# Patient Record
Sex: Female | Born: 1945 | Race: White | Hispanic: No | Marital: Married | Smoking: Never smoker
Health system: Southern US, Community
[De-identification: ages and names within clinical notes are randomized; demographics above are authoritative.]

## PROBLEM LIST (undated history)

## (undated) DIAGNOSIS — K219 Gastro-esophageal reflux disease without esophagitis: Secondary | ICD-10-CM

## (undated) DIAGNOSIS — E785 Hyperlipidemia, unspecified: Secondary | ICD-10-CM

## (undated) DIAGNOSIS — F429 Obsessive-compulsive disorder, unspecified: Secondary | ICD-10-CM

## (undated) DIAGNOSIS — K589 Irritable bowel syndrome without diarrhea: Secondary | ICD-10-CM

## (undated) DIAGNOSIS — L309 Dermatitis, unspecified: Secondary | ICD-10-CM

## (undated) DIAGNOSIS — I10 Essential (primary) hypertension: Secondary | ICD-10-CM

## (undated) DIAGNOSIS — Z789 Other specified health status: Secondary | ICD-10-CM

## (undated) DIAGNOSIS — Z7409 Other reduced mobility: Secondary | ICD-10-CM

## (undated) DIAGNOSIS — F329 Major depressive disorder, single episode, unspecified: Secondary | ICD-10-CM

## (undated) DIAGNOSIS — K5790 Diverticulosis of intestine, part unspecified, without perforation or abscess without bleeding: Secondary | ICD-10-CM

## (undated) HISTORY — DX: Dermatitis, unspecified: L30.9

## (undated) HISTORY — DX: Gastro-esophageal reflux disease without esophagitis: K21.9

## (undated) HISTORY — DX: Hyperlipidemia, unspecified: E78.5

## (undated) HISTORY — PX: REFRACTIVE SURGERY: SHX103

## (undated) HISTORY — DX: Obsessive-compulsive disorder, unspecified: F42.9

## (undated) HISTORY — DX: Essential (primary) hypertension: I10

## (undated) HISTORY — DX: Other reduced mobility: Z74.09

## (undated) HISTORY — DX: Irritable bowel syndrome, unspecified: K58.9

## (undated) HISTORY — DX: Diverticulosis of intestine, part unspecified, without perforation or abscess without bleeding: K57.90

## (undated) HISTORY — PX: TUBAL LIGATION: SHX77

## (undated) HISTORY — PX: THYROIDECTOMY, PARTIAL: SHX18

## (undated) HISTORY — PX: LAMINECTOMY: SHX219

## (undated) HISTORY — DX: Other specified health status: Z78.9

## (undated) HISTORY — DX: Major depressive disorder, single episode, unspecified: F32.9

---

## 1998-04-13 ENCOUNTER — Other Ambulatory Visit: Admission: RE | Admit: 1998-04-13 | Discharge: 1998-04-13 | Payer: Self-pay | Admitting: Obstetrics & Gynecology

## 1999-04-18 ENCOUNTER — Other Ambulatory Visit: Admission: RE | Admit: 1999-04-18 | Discharge: 1999-04-18 | Payer: Self-pay | Admitting: Obstetrics & Gynecology

## 2000-06-04 ENCOUNTER — Other Ambulatory Visit: Admission: RE | Admit: 2000-06-04 | Discharge: 2000-06-04 | Payer: Self-pay | Admitting: Obstetrics & Gynecology

## 2001-07-14 ENCOUNTER — Other Ambulatory Visit: Admission: RE | Admit: 2001-07-14 | Discharge: 2001-07-14 | Payer: Self-pay | Admitting: Obstetrics & Gynecology

## 2002-07-21 ENCOUNTER — Other Ambulatory Visit: Admission: RE | Admit: 2002-07-21 | Discharge: 2002-07-21 | Payer: Self-pay | Admitting: Obstetrics & Gynecology

## 2003-07-27 ENCOUNTER — Other Ambulatory Visit: Admission: RE | Admit: 2003-07-27 | Discharge: 2003-07-27 | Payer: Self-pay | Admitting: Obstetrics & Gynecology

## 2004-07-30 ENCOUNTER — Other Ambulatory Visit: Admission: RE | Admit: 2004-07-30 | Discharge: 2004-07-30 | Payer: Self-pay | Admitting: Obstetrics & Gynecology

## 2005-11-29 DIAGNOSIS — Z9884 Bariatric surgery status: Secondary | ICD-10-CM

## 2005-11-29 HISTORY — DX: Bariatric surgery status: Z98.84

## 2011-05-16 DIAGNOSIS — F339 Major depressive disorder, recurrent, unspecified: Secondary | ICD-10-CM | POA: Diagnosis not present

## 2011-05-20 DIAGNOSIS — S0180XA Unspecified open wound of other part of head, initial encounter: Secondary | ICD-10-CM | POA: Diagnosis not present

## 2011-05-20 DIAGNOSIS — M546 Pain in thoracic spine: Secondary | ICD-10-CM | POA: Diagnosis not present

## 2011-05-30 DIAGNOSIS — F339 Major depressive disorder, recurrent, unspecified: Secondary | ICD-10-CM | POA: Diagnosis not present

## 2011-06-10 DIAGNOSIS — I10 Essential (primary) hypertension: Secondary | ICD-10-CM | POA: Diagnosis not present

## 2011-06-10 DIAGNOSIS — R55 Syncope and collapse: Secondary | ICD-10-CM | POA: Diagnosis not present

## 2011-06-10 DIAGNOSIS — E7800 Pure hypercholesterolemia, unspecified: Secondary | ICD-10-CM | POA: Diagnosis not present

## 2011-06-17 DIAGNOSIS — F339 Major depressive disorder, recurrent, unspecified: Secondary | ICD-10-CM | POA: Diagnosis not present

## 2011-06-20 DIAGNOSIS — R55 Syncope and collapse: Secondary | ICD-10-CM | POA: Diagnosis not present

## 2011-06-21 DIAGNOSIS — R55 Syncope and collapse: Secondary | ICD-10-CM | POA: Diagnosis not present

## 2011-07-03 DIAGNOSIS — K644 Residual hemorrhoidal skin tags: Secondary | ICD-10-CM | POA: Diagnosis not present

## 2011-07-05 DIAGNOSIS — Z9884 Bariatric surgery status: Secondary | ICD-10-CM | POA: Diagnosis not present

## 2011-07-05 DIAGNOSIS — R1310 Dysphagia, unspecified: Secondary | ICD-10-CM | POA: Diagnosis not present

## 2011-07-08 DIAGNOSIS — F339 Major depressive disorder, recurrent, unspecified: Secondary | ICD-10-CM | POA: Diagnosis not present

## 2011-07-08 DIAGNOSIS — I10 Essential (primary) hypertension: Secondary | ICD-10-CM | POA: Diagnosis not present

## 2011-07-08 DIAGNOSIS — R55 Syncope and collapse: Secondary | ICD-10-CM | POA: Diagnosis not present

## 2011-07-09 DIAGNOSIS — Z1211 Encounter for screening for malignant neoplasm of colon: Secondary | ICD-10-CM | POA: Diagnosis not present

## 2011-07-09 DIAGNOSIS — K648 Other hemorrhoids: Secondary | ICD-10-CM | POA: Diagnosis not present

## 2011-07-09 DIAGNOSIS — E7800 Pure hypercholesterolemia, unspecified: Secondary | ICD-10-CM | POA: Diagnosis not present

## 2011-07-09 DIAGNOSIS — K644 Residual hemorrhoidal skin tags: Secondary | ICD-10-CM | POA: Diagnosis not present

## 2011-07-09 DIAGNOSIS — Z79899 Other long term (current) drug therapy: Secondary | ICD-10-CM | POA: Diagnosis not present

## 2011-07-09 DIAGNOSIS — D126 Benign neoplasm of colon, unspecified: Secondary | ICD-10-CM | POA: Diagnosis not present

## 2011-07-09 DIAGNOSIS — I10 Essential (primary) hypertension: Secondary | ICD-10-CM | POA: Diagnosis not present

## 2011-07-09 DIAGNOSIS — F329 Major depressive disorder, single episode, unspecified: Secondary | ICD-10-CM | POA: Diagnosis not present

## 2011-07-09 DIAGNOSIS — Z8052 Family history of malignant neoplasm of bladder: Secondary | ICD-10-CM | POA: Diagnosis not present

## 2011-07-11 DIAGNOSIS — R439 Unspecified disturbances of smell and taste: Secondary | ICD-10-CM | POA: Diagnosis not present

## 2011-07-11 DIAGNOSIS — R55 Syncope and collapse: Secondary | ICD-10-CM | POA: Diagnosis not present

## 2011-08-13 DIAGNOSIS — F339 Major depressive disorder, recurrent, unspecified: Secondary | ICD-10-CM | POA: Diagnosis not present

## 2011-08-29 DIAGNOSIS — E669 Obesity, unspecified: Secondary | ICD-10-CM | POA: Diagnosis not present

## 2011-08-29 DIAGNOSIS — T730XXA Starvation, initial encounter: Secondary | ICD-10-CM | POA: Diagnosis not present

## 2011-09-09 DIAGNOSIS — I10 Essential (primary) hypertension: Secondary | ICD-10-CM | POA: Diagnosis not present

## 2011-09-09 DIAGNOSIS — E7800 Pure hypercholesterolemia, unspecified: Secondary | ICD-10-CM | POA: Diagnosis not present

## 2011-09-09 DIAGNOSIS — Z79899 Other long term (current) drug therapy: Secondary | ICD-10-CM | POA: Diagnosis not present

## 2011-10-07 DIAGNOSIS — E7800 Pure hypercholesterolemia, unspecified: Secondary | ICD-10-CM | POA: Diagnosis not present

## 2011-10-07 DIAGNOSIS — I10 Essential (primary) hypertension: Secondary | ICD-10-CM | POA: Diagnosis not present

## 2011-10-07 DIAGNOSIS — Z79899 Other long term (current) drug therapy: Secondary | ICD-10-CM | POA: Diagnosis not present

## 2011-10-14 DIAGNOSIS — F339 Major depressive disorder, recurrent, unspecified: Secondary | ICD-10-CM | POA: Diagnosis not present

## 2011-12-24 DIAGNOSIS — F339 Major depressive disorder, recurrent, unspecified: Secondary | ICD-10-CM | POA: Diagnosis not present

## 2012-01-23 DIAGNOSIS — S41109A Unspecified open wound of unspecified upper arm, initial encounter: Secondary | ICD-10-CM | POA: Diagnosis not present

## 2012-01-23 DIAGNOSIS — E7800 Pure hypercholesterolemia, unspecified: Secondary | ICD-10-CM | POA: Diagnosis not present

## 2012-01-23 DIAGNOSIS — D72819 Decreased white blood cell count, unspecified: Secondary | ICD-10-CM | POA: Diagnosis not present

## 2012-01-23 DIAGNOSIS — F329 Major depressive disorder, single episode, unspecified: Secondary | ICD-10-CM | POA: Diagnosis not present

## 2012-01-23 DIAGNOSIS — Z23 Encounter for immunization: Secondary | ICD-10-CM | POA: Diagnosis not present

## 2012-01-23 DIAGNOSIS — I10 Essential (primary) hypertension: Secondary | ICD-10-CM | POA: Diagnosis not present

## 2012-03-19 DIAGNOSIS — Z1231 Encounter for screening mammogram for malignant neoplasm of breast: Secondary | ICD-10-CM | POA: Diagnosis not present

## 2012-03-23 DIAGNOSIS — F339 Major depressive disorder, recurrent, unspecified: Secondary | ICD-10-CM | POA: Diagnosis not present

## 2012-06-22 DIAGNOSIS — F339 Major depressive disorder, recurrent, unspecified: Secondary | ICD-10-CM | POA: Diagnosis not present

## 2012-08-12 DIAGNOSIS — E7800 Pure hypercholesterolemia, unspecified: Secondary | ICD-10-CM | POA: Diagnosis not present

## 2012-08-12 DIAGNOSIS — I10 Essential (primary) hypertension: Secondary | ICD-10-CM | POA: Diagnosis not present

## 2012-08-12 DIAGNOSIS — Z79899 Other long term (current) drug therapy: Secondary | ICD-10-CM | POA: Diagnosis not present

## 2012-09-21 DIAGNOSIS — F339 Major depressive disorder, recurrent, unspecified: Secondary | ICD-10-CM | POA: Diagnosis not present

## 2012-12-02 DIAGNOSIS — E7800 Pure hypercholesterolemia, unspecified: Secondary | ICD-10-CM | POA: Diagnosis not present

## 2012-12-02 DIAGNOSIS — Z79899 Other long term (current) drug therapy: Secondary | ICD-10-CM | POA: Diagnosis not present

## 2012-12-02 DIAGNOSIS — M79609 Pain in unspecified limb: Secondary | ICD-10-CM | POA: Diagnosis not present

## 2012-12-02 DIAGNOSIS — R21 Rash and other nonspecific skin eruption: Secondary | ICD-10-CM | POA: Diagnosis not present

## 2012-12-02 DIAGNOSIS — I10 Essential (primary) hypertension: Secondary | ICD-10-CM | POA: Diagnosis not present

## 2012-12-21 DIAGNOSIS — L820 Inflamed seborrheic keratosis: Secondary | ICD-10-CM | POA: Diagnosis not present

## 2012-12-21 DIAGNOSIS — F339 Major depressive disorder, recurrent, unspecified: Secondary | ICD-10-CM | POA: Diagnosis not present

## 2012-12-21 DIAGNOSIS — D1739 Benign lipomatous neoplasm of skin and subcutaneous tissue of other sites: Secondary | ICD-10-CM | POA: Diagnosis not present

## 2013-01-12 DIAGNOSIS — H5210 Myopia, unspecified eye: Secondary | ICD-10-CM | POA: Diagnosis not present

## 2013-01-12 DIAGNOSIS — Z961 Presence of intraocular lens: Secondary | ICD-10-CM | POA: Diagnosis not present

## 2013-01-12 DIAGNOSIS — Z9849 Cataract extraction status, unspecified eye: Secondary | ICD-10-CM | POA: Diagnosis not present

## 2013-01-12 DIAGNOSIS — H5200 Hypermetropia, unspecified eye: Secondary | ICD-10-CM | POA: Diagnosis not present

## 2013-03-16 DIAGNOSIS — F339 Major depressive disorder, recurrent, unspecified: Secondary | ICD-10-CM | POA: Diagnosis not present

## 2013-03-18 DIAGNOSIS — Z23 Encounter for immunization: Secondary | ICD-10-CM | POA: Diagnosis not present

## 2013-04-05 DIAGNOSIS — E7800 Pure hypercholesterolemia, unspecified: Secondary | ICD-10-CM | POA: Diagnosis not present

## 2013-04-05 DIAGNOSIS — I10 Essential (primary) hypertension: Secondary | ICD-10-CM | POA: Diagnosis not present

## 2013-04-05 DIAGNOSIS — Z1239 Encounter for other screening for malignant neoplasm of breast: Secondary | ICD-10-CM | POA: Diagnosis not present

## 2013-04-05 DIAGNOSIS — Z6832 Body mass index (BMI) 32.0-32.9, adult: Secondary | ICD-10-CM | POA: Diagnosis not present

## 2013-04-08 DIAGNOSIS — Z1231 Encounter for screening mammogram for malignant neoplasm of breast: Secondary | ICD-10-CM | POA: Diagnosis not present

## 2013-06-16 DIAGNOSIS — F339 Major depressive disorder, recurrent, unspecified: Secondary | ICD-10-CM | POA: Diagnosis not present

## 2013-07-14 DIAGNOSIS — F339 Major depressive disorder, recurrent, unspecified: Secondary | ICD-10-CM | POA: Diagnosis not present

## 2013-08-10 DIAGNOSIS — F339 Major depressive disorder, recurrent, unspecified: Secondary | ICD-10-CM | POA: Diagnosis not present

## 2013-08-24 DIAGNOSIS — F339 Major depressive disorder, recurrent, unspecified: Secondary | ICD-10-CM | POA: Diagnosis not present

## 2013-09-17 DIAGNOSIS — F339 Major depressive disorder, recurrent, unspecified: Secondary | ICD-10-CM | POA: Diagnosis not present

## 2013-09-19 DIAGNOSIS — L2089 Other atopic dermatitis: Secondary | ICD-10-CM | POA: Diagnosis not present

## 2013-09-21 DIAGNOSIS — F329 Major depressive disorder, single episode, unspecified: Secondary | ICD-10-CM | POA: Diagnosis not present

## 2013-09-21 DIAGNOSIS — F3289 Other specified depressive episodes: Secondary | ICD-10-CM | POA: Diagnosis not present

## 2013-09-21 DIAGNOSIS — R21 Rash and other nonspecific skin eruption: Secondary | ICD-10-CM | POA: Diagnosis not present

## 2013-10-13 DIAGNOSIS — F329 Major depressive disorder, single episode, unspecified: Secondary | ICD-10-CM | POA: Diagnosis not present

## 2013-10-13 DIAGNOSIS — I10 Essential (primary) hypertension: Secondary | ICD-10-CM | POA: Diagnosis not present

## 2013-10-13 DIAGNOSIS — E7800 Pure hypercholesterolemia, unspecified: Secondary | ICD-10-CM | POA: Diagnosis not present

## 2013-10-13 DIAGNOSIS — Z79899 Other long term (current) drug therapy: Secondary | ICD-10-CM | POA: Diagnosis not present

## 2013-10-13 DIAGNOSIS — F3289 Other specified depressive episodes: Secondary | ICD-10-CM | POA: Diagnosis not present

## 2013-10-14 DIAGNOSIS — Z4651 Encounter for fitting and adjustment of gastric lap band: Secondary | ICD-10-CM | POA: Diagnosis not present

## 2013-10-14 DIAGNOSIS — T7589XS Other specified effects of external causes, sequela: Secondary | ICD-10-CM | POA: Diagnosis not present

## 2013-10-14 DIAGNOSIS — T730XXA Starvation, initial encounter: Secondary | ICD-10-CM

## 2013-10-14 DIAGNOSIS — X58XXXS Exposure to other specified factors, sequela: Secondary | ICD-10-CM | POA: Diagnosis not present

## 2013-10-14 HISTORY — DX: Morbid (severe) obesity due to excess calories: E66.01

## 2013-10-14 HISTORY — DX: Starvation, initial encounter: T73.0XXA

## 2013-10-15 DIAGNOSIS — F339 Major depressive disorder, recurrent, unspecified: Secondary | ICD-10-CM | POA: Diagnosis not present

## 2013-10-25 DIAGNOSIS — Z23 Encounter for immunization: Secondary | ICD-10-CM | POA: Diagnosis not present

## 2013-10-25 DIAGNOSIS — E7800 Pure hypercholesterolemia, unspecified: Secondary | ICD-10-CM | POA: Diagnosis not present

## 2013-10-25 DIAGNOSIS — Z0000 Encounter for general adult medical examination without abnormal findings: Secondary | ICD-10-CM | POA: Diagnosis not present

## 2013-10-25 DIAGNOSIS — F339 Major depressive disorder, recurrent, unspecified: Secondary | ICD-10-CM | POA: Diagnosis not present

## 2013-10-25 DIAGNOSIS — Z6832 Body mass index (BMI) 32.0-32.9, adult: Secondary | ICD-10-CM | POA: Diagnosis not present

## 2013-10-25 DIAGNOSIS — M899 Disorder of bone, unspecified: Secondary | ICD-10-CM | POA: Diagnosis not present

## 2013-10-25 DIAGNOSIS — Z9884 Bariatric surgery status: Secondary | ICD-10-CM | POA: Diagnosis not present

## 2013-10-25 DIAGNOSIS — M25519 Pain in unspecified shoulder: Secondary | ICD-10-CM | POA: Diagnosis not present

## 2013-10-25 DIAGNOSIS — H539 Unspecified visual disturbance: Secondary | ICD-10-CM | POA: Diagnosis not present

## 2013-10-25 DIAGNOSIS — Z79899 Other long term (current) drug therapy: Secondary | ICD-10-CM | POA: Diagnosis not present

## 2013-10-25 DIAGNOSIS — M949 Disorder of cartilage, unspecified: Secondary | ICD-10-CM | POA: Diagnosis not present

## 2013-11-17 DIAGNOSIS — M810 Age-related osteoporosis without current pathological fracture: Secondary | ICD-10-CM | POA: Diagnosis not present

## 2013-11-17 DIAGNOSIS — M899 Disorder of bone, unspecified: Secondary | ICD-10-CM | POA: Diagnosis not present

## 2013-12-14 DIAGNOSIS — F339 Major depressive disorder, recurrent, unspecified: Secondary | ICD-10-CM | POA: Diagnosis not present

## 2014-01-13 DIAGNOSIS — F339 Major depressive disorder, recurrent, unspecified: Secondary | ICD-10-CM | POA: Diagnosis not present

## 2014-02-16 DIAGNOSIS — I10 Essential (primary) hypertension: Secondary | ICD-10-CM | POA: Diagnosis not present

## 2014-02-16 DIAGNOSIS — M25511 Pain in right shoulder: Secondary | ICD-10-CM | POA: Diagnosis not present

## 2014-02-16 DIAGNOSIS — F329 Major depressive disorder, single episode, unspecified: Secondary | ICD-10-CM | POA: Diagnosis not present

## 2014-02-16 DIAGNOSIS — Z23 Encounter for immunization: Secondary | ICD-10-CM | POA: Diagnosis not present

## 2014-02-16 DIAGNOSIS — E780 Pure hypercholesterolemia: Secondary | ICD-10-CM | POA: Diagnosis not present

## 2014-03-07 DIAGNOSIS — H5213 Myopia, bilateral: Secondary | ICD-10-CM | POA: Diagnosis not present

## 2014-03-07 DIAGNOSIS — H43813 Vitreous degeneration, bilateral: Secondary | ICD-10-CM | POA: Diagnosis not present

## 2014-03-08 DIAGNOSIS — F330 Major depressive disorder, recurrent, mild: Secondary | ICD-10-CM | POA: Diagnosis not present

## 2014-03-09 DIAGNOSIS — M25511 Pain in right shoulder: Secondary | ICD-10-CM | POA: Diagnosis not present

## 2014-03-09 DIAGNOSIS — M7541 Impingement syndrome of right shoulder: Secondary | ICD-10-CM | POA: Diagnosis not present

## 2014-03-15 DIAGNOSIS — M7541 Impingement syndrome of right shoulder: Secondary | ICD-10-CM | POA: Diagnosis not present

## 2014-03-15 DIAGNOSIS — M25511 Pain in right shoulder: Secondary | ICD-10-CM | POA: Diagnosis not present

## 2014-03-15 DIAGNOSIS — M25611 Stiffness of right shoulder, not elsewhere classified: Secondary | ICD-10-CM | POA: Diagnosis not present

## 2014-04-11 DIAGNOSIS — Z1231 Encounter for screening mammogram for malignant neoplasm of breast: Secondary | ICD-10-CM | POA: Diagnosis not present

## 2014-05-02 DIAGNOSIS — F331 Major depressive disorder, recurrent, moderate: Secondary | ICD-10-CM | POA: Diagnosis not present

## 2014-05-10 DIAGNOSIS — F331 Major depressive disorder, recurrent, moderate: Secondary | ICD-10-CM | POA: Diagnosis not present

## 2014-05-12 DIAGNOSIS — L821 Other seborrheic keratosis: Secondary | ICD-10-CM | POA: Diagnosis not present

## 2014-05-12 DIAGNOSIS — B351 Tinea unguium: Secondary | ICD-10-CM | POA: Diagnosis not present

## 2014-05-24 DIAGNOSIS — F330 Major depressive disorder, recurrent, mild: Secondary | ICD-10-CM | POA: Diagnosis not present

## 2014-05-31 DIAGNOSIS — F331 Major depressive disorder, recurrent, moderate: Secondary | ICD-10-CM | POA: Diagnosis not present

## 2014-07-07 DIAGNOSIS — D485 Neoplasm of uncertain behavior of skin: Secondary | ICD-10-CM | POA: Diagnosis not present

## 2014-07-07 DIAGNOSIS — I10 Essential (primary) hypertension: Secondary | ICD-10-CM | POA: Diagnosis not present

## 2014-07-07 DIAGNOSIS — E780 Pure hypercholesterolemia: Secondary | ICD-10-CM | POA: Diagnosis not present

## 2014-07-07 DIAGNOSIS — Z79899 Other long term (current) drug therapy: Secondary | ICD-10-CM | POA: Diagnosis not present

## 2014-07-19 DIAGNOSIS — F3341 Major depressive disorder, recurrent, in partial remission: Secondary | ICD-10-CM | POA: Diagnosis not present

## 2014-07-20 DIAGNOSIS — K219 Gastro-esophageal reflux disease without esophagitis: Secondary | ICD-10-CM | POA: Diagnosis not present

## 2014-09-05 DIAGNOSIS — F3341 Major depressive disorder, recurrent, in partial remission: Secondary | ICD-10-CM | POA: Diagnosis not present

## 2014-10-12 DIAGNOSIS — F3341 Major depressive disorder, recurrent, in partial remission: Secondary | ICD-10-CM | POA: Diagnosis not present

## 2014-10-27 DIAGNOSIS — F3341 Major depressive disorder, recurrent, in partial remission: Secondary | ICD-10-CM | POA: Diagnosis not present

## 2014-10-27 DIAGNOSIS — L738 Other specified follicular disorders: Secondary | ICD-10-CM | POA: Diagnosis not present

## 2014-11-03 DIAGNOSIS — B351 Tinea unguium: Secondary | ICD-10-CM | POA: Diagnosis not present

## 2014-11-03 DIAGNOSIS — C44722 Squamous cell carcinoma of skin of right lower limb, including hip: Secondary | ICD-10-CM | POA: Diagnosis not present

## 2014-11-04 DIAGNOSIS — M25511 Pain in right shoulder: Secondary | ICD-10-CM | POA: Diagnosis not present

## 2014-11-15 DIAGNOSIS — S46811A Strain of other muscles, fascia and tendons at shoulder and upper arm level, right arm, initial encounter: Secondary | ICD-10-CM | POA: Diagnosis not present

## 2014-11-15 DIAGNOSIS — X58XXXA Exposure to other specified factors, initial encounter: Secondary | ICD-10-CM | POA: Diagnosis not present

## 2014-11-15 DIAGNOSIS — M25511 Pain in right shoulder: Secondary | ICD-10-CM | POA: Diagnosis not present

## 2014-11-15 DIAGNOSIS — F330 Major depressive disorder, recurrent, mild: Secondary | ICD-10-CM | POA: Diagnosis not present

## 2014-11-17 DIAGNOSIS — Z01419 Encounter for gynecological examination (general) (routine) without abnormal findings: Secondary | ICD-10-CM | POA: Diagnosis not present

## 2014-11-17 DIAGNOSIS — I10 Essential (primary) hypertension: Secondary | ICD-10-CM | POA: Diagnosis not present

## 2014-11-17 DIAGNOSIS — E780 Pure hypercholesterolemia: Secondary | ICD-10-CM | POA: Diagnosis not present

## 2014-11-17 DIAGNOSIS — C44722 Squamous cell carcinoma of skin of right lower limb, including hip: Secondary | ICD-10-CM | POA: Diagnosis not present

## 2014-11-18 DIAGNOSIS — M25511 Pain in right shoulder: Secondary | ICD-10-CM | POA: Diagnosis not present

## 2014-11-25 DIAGNOSIS — R072 Precordial pain: Secondary | ICD-10-CM | POA: Diagnosis not present

## 2015-01-16 DIAGNOSIS — F3341 Major depressive disorder, recurrent, in partial remission: Secondary | ICD-10-CM | POA: Diagnosis not present

## 2015-02-09 DIAGNOSIS — S81801S Unspecified open wound, right lower leg, sequela: Secondary | ICD-10-CM | POA: Diagnosis not present

## 2015-02-09 DIAGNOSIS — D492 Neoplasm of unspecified behavior of bone, soft tissue, and skin: Secondary | ICD-10-CM | POA: Diagnosis not present

## 2015-02-09 DIAGNOSIS — M542 Cervicalgia: Secondary | ICD-10-CM | POA: Diagnosis not present

## 2015-02-15 DIAGNOSIS — Z23 Encounter for immunization: Secondary | ICD-10-CM | POA: Diagnosis not present

## 2015-02-15 DIAGNOSIS — S81801S Unspecified open wound, right lower leg, sequela: Secondary | ICD-10-CM | POA: Diagnosis not present

## 2015-02-15 DIAGNOSIS — H6123 Impacted cerumen, bilateral: Secondary | ICD-10-CM | POA: Diagnosis not present

## 2015-02-21 DIAGNOSIS — D485 Neoplasm of uncertain behavior of skin: Secondary | ICD-10-CM | POA: Diagnosis not present

## 2015-02-21 DIAGNOSIS — L821 Other seborrheic keratosis: Secondary | ICD-10-CM | POA: Diagnosis not present

## 2015-03-13 DIAGNOSIS — H43813 Vitreous degeneration, bilateral: Secondary | ICD-10-CM | POA: Diagnosis not present

## 2015-03-16 DIAGNOSIS — F3341 Major depressive disorder, recurrent, in partial remission: Secondary | ICD-10-CM | POA: Diagnosis not present

## 2015-04-06 DIAGNOSIS — H43813 Vitreous degeneration, bilateral: Secondary | ICD-10-CM | POA: Diagnosis not present

## 2015-04-06 DIAGNOSIS — H4423 Degenerative myopia, bilateral: Secondary | ICD-10-CM | POA: Diagnosis not present

## 2015-04-20 DIAGNOSIS — D1801 Hemangioma of skin and subcutaneous tissue: Secondary | ICD-10-CM | POA: Diagnosis not present

## 2015-04-20 DIAGNOSIS — L570 Actinic keratosis: Secondary | ICD-10-CM | POA: Diagnosis not present

## 2015-04-20 DIAGNOSIS — L578 Other skin changes due to chronic exposure to nonionizing radiation: Secondary | ICD-10-CM | POA: Diagnosis not present

## 2015-04-20 DIAGNOSIS — C44722 Squamous cell carcinoma of skin of right lower limb, including hip: Secondary | ICD-10-CM | POA: Diagnosis not present

## 2015-04-20 DIAGNOSIS — L820 Inflamed seborrheic keratosis: Secondary | ICD-10-CM | POA: Diagnosis not present

## 2015-04-20 DIAGNOSIS — L821 Other seborrheic keratosis: Secondary | ICD-10-CM | POA: Diagnosis not present

## 2015-05-11 DIAGNOSIS — E7800 Pure hypercholesterolemia, unspecified: Secondary | ICD-10-CM | POA: Diagnosis not present

## 2015-05-11 DIAGNOSIS — R12 Heartburn: Secondary | ICD-10-CM | POA: Diagnosis not present

## 2015-05-11 DIAGNOSIS — E559 Vitamin D deficiency, unspecified: Secondary | ICD-10-CM | POA: Diagnosis not present

## 2015-05-11 DIAGNOSIS — I10 Essential (primary) hypertension: Secondary | ICD-10-CM | POA: Diagnosis not present

## 2015-05-11 DIAGNOSIS — Z1239 Encounter for other screening for malignant neoplasm of breast: Secondary | ICD-10-CM | POA: Diagnosis not present

## 2015-05-16 DIAGNOSIS — Z1231 Encounter for screening mammogram for malignant neoplasm of breast: Secondary | ICD-10-CM | POA: Diagnosis not present

## 2015-05-16 DIAGNOSIS — F3341 Major depressive disorder, recurrent, in partial remission: Secondary | ICD-10-CM | POA: Diagnosis not present

## 2015-05-29 DIAGNOSIS — H02121 Mechanical ectropion of right upper eyelid: Secondary | ICD-10-CM | POA: Diagnosis not present

## 2015-06-15 DIAGNOSIS — F3341 Major depressive disorder, recurrent, in partial remission: Secondary | ICD-10-CM | POA: Diagnosis not present

## 2015-06-21 DIAGNOSIS — F331 Major depressive disorder, recurrent, moderate: Secondary | ICD-10-CM | POA: Diagnosis not present

## 2015-07-13 DIAGNOSIS — F411 Generalized anxiety disorder: Secondary | ICD-10-CM | POA: Diagnosis not present

## 2015-08-02 DIAGNOSIS — H43813 Vitreous degeneration, bilateral: Secondary | ICD-10-CM | POA: Diagnosis not present

## 2015-08-02 DIAGNOSIS — H524 Presbyopia: Secondary | ICD-10-CM | POA: Diagnosis not present

## 2015-08-09 DIAGNOSIS — F3341 Major depressive disorder, recurrent, in partial remission: Secondary | ICD-10-CM | POA: Diagnosis not present

## 2015-09-07 DIAGNOSIS — F331 Major depressive disorder, recurrent, moderate: Secondary | ICD-10-CM | POA: Diagnosis not present

## 2015-09-21 DIAGNOSIS — F3341 Major depressive disorder, recurrent, in partial remission: Secondary | ICD-10-CM | POA: Diagnosis not present

## 2015-09-26 DIAGNOSIS — F331 Major depressive disorder, recurrent, moderate: Secondary | ICD-10-CM | POA: Diagnosis not present

## 2015-10-24 DIAGNOSIS — F3341 Major depressive disorder, recurrent, in partial remission: Secondary | ICD-10-CM | POA: Diagnosis not present

## 2015-11-02 DIAGNOSIS — R1319 Other dysphagia: Secondary | ICD-10-CM | POA: Diagnosis not present

## 2015-11-02 DIAGNOSIS — Z9884 Bariatric surgery status: Secondary | ICD-10-CM | POA: Diagnosis not present

## 2015-11-02 DIAGNOSIS — K219 Gastro-esophageal reflux disease without esophagitis: Secondary | ICD-10-CM

## 2015-11-02 HISTORY — DX: Gastro-esophageal reflux disease without esophagitis: K21.9

## 2015-11-06 ENCOUNTER — Other Ambulatory Visit: Payer: Self-pay | Admitting: Surgical Oncology

## 2015-11-06 DIAGNOSIS — R1310 Dysphagia, unspecified: Secondary | ICD-10-CM

## 2015-11-14 ENCOUNTER — Ambulatory Visit
Admission: RE | Admit: 2015-11-14 | Discharge: 2015-11-14 | Disposition: A | Discharge status: Home / Self Care | Payer: Medicare Other | Source: Ambulatory Visit | Attending: Surgical Oncology | Admitting: Surgical Oncology

## 2015-11-14 DIAGNOSIS — R1310 Dysphagia, unspecified: Secondary | ICD-10-CM | POA: Diagnosis not present

## 2015-11-14 DIAGNOSIS — Z9884 Bariatric surgery status: Secondary | ICD-10-CM | POA: Diagnosis not present

## 2015-11-17 DIAGNOSIS — F330 Major depressive disorder, recurrent, mild: Secondary | ICD-10-CM | POA: Diagnosis not present

## 2015-11-23 DIAGNOSIS — E7800 Pure hypercholesterolemia, unspecified: Secondary | ICD-10-CM | POA: Diagnosis not present

## 2015-11-23 DIAGNOSIS — I10 Essential (primary) hypertension: Secondary | ICD-10-CM | POA: Diagnosis not present

## 2015-11-23 DIAGNOSIS — K219 Gastro-esophageal reflux disease without esophagitis: Secondary | ICD-10-CM | POA: Diagnosis not present

## 2015-12-21 DIAGNOSIS — F3341 Major depressive disorder, recurrent, in partial remission: Secondary | ICD-10-CM | POA: Diagnosis not present

## 2016-01-01 DIAGNOSIS — F331 Major depressive disorder, recurrent, moderate: Secondary | ICD-10-CM | POA: Diagnosis not present

## 2016-01-17 DIAGNOSIS — F331 Major depressive disorder, recurrent, moderate: Secondary | ICD-10-CM | POA: Diagnosis not present

## 2016-01-24 DIAGNOSIS — F331 Major depressive disorder, recurrent, moderate: Secondary | ICD-10-CM | POA: Diagnosis not present

## 2016-02-07 DIAGNOSIS — F331 Major depressive disorder, recurrent, moderate: Secondary | ICD-10-CM | POA: Diagnosis not present

## 2016-02-14 DIAGNOSIS — F331 Major depressive disorder, recurrent, moderate: Secondary | ICD-10-CM | POA: Diagnosis not present

## 2016-02-27 DIAGNOSIS — L820 Inflamed seborrheic keratosis: Secondary | ICD-10-CM | POA: Diagnosis not present

## 2016-02-27 DIAGNOSIS — L821 Other seborrheic keratosis: Secondary | ICD-10-CM | POA: Diagnosis not present

## 2016-02-27 DIAGNOSIS — D485 Neoplasm of uncertain behavior of skin: Secondary | ICD-10-CM | POA: Diagnosis not present

## 2016-02-28 DIAGNOSIS — F3341 Major depressive disorder, recurrent, in partial remission: Secondary | ICD-10-CM | POA: Diagnosis not present

## 2016-03-07 DIAGNOSIS — F3341 Major depressive disorder, recurrent, in partial remission: Secondary | ICD-10-CM | POA: Diagnosis not present

## 2016-03-23 DIAGNOSIS — L508 Other urticaria: Secondary | ICD-10-CM | POA: Diagnosis not present

## 2016-04-08 DIAGNOSIS — Z23 Encounter for immunization: Secondary | ICD-10-CM | POA: Diagnosis not present

## 2016-04-08 DIAGNOSIS — E663 Overweight: Secondary | ICD-10-CM | POA: Diagnosis not present

## 2016-04-08 DIAGNOSIS — Z113 Encounter for screening for infections with a predominantly sexual mode of transmission: Secondary | ICD-10-CM | POA: Diagnosis not present

## 2016-04-08 DIAGNOSIS — Z79899 Other long term (current) drug therapy: Secondary | ICD-10-CM | POA: Diagnosis not present

## 2016-04-08 DIAGNOSIS — F3341 Major depressive disorder, recurrent, in partial remission: Secondary | ICD-10-CM | POA: Diagnosis not present

## 2016-04-08 DIAGNOSIS — E559 Vitamin D deficiency, unspecified: Secondary | ICD-10-CM | POA: Diagnosis not present

## 2016-04-08 DIAGNOSIS — Z6829 Body mass index (BMI) 29.0-29.9, adult: Secondary | ICD-10-CM | POA: Diagnosis not present

## 2016-04-08 DIAGNOSIS — Z0000 Encounter for general adult medical examination without abnormal findings: Secondary | ICD-10-CM | POA: Diagnosis not present

## 2016-04-08 DIAGNOSIS — I10 Essential (primary) hypertension: Secondary | ICD-10-CM | POA: Diagnosis not present

## 2016-04-08 DIAGNOSIS — F339 Major depressive disorder, recurrent, unspecified: Secondary | ICD-10-CM | POA: Diagnosis not present

## 2016-04-08 DIAGNOSIS — K219 Gastro-esophageal reflux disease without esophagitis: Secondary | ICD-10-CM | POA: Diagnosis not present

## 2016-04-08 DIAGNOSIS — E7800 Pure hypercholesterolemia, unspecified: Secondary | ICD-10-CM | POA: Diagnosis not present

## 2016-04-08 DIAGNOSIS — Z6830 Body mass index (BMI) 30.0-30.9, adult: Secondary | ICD-10-CM | POA: Diagnosis not present

## 2016-05-13 DIAGNOSIS — R21 Rash and other nonspecific skin eruption: Secondary | ICD-10-CM | POA: Diagnosis not present

## 2016-05-13 DIAGNOSIS — B372 Candidiasis of skin and nail: Secondary | ICD-10-CM | POA: Diagnosis not present

## 2016-05-13 DIAGNOSIS — F3341 Major depressive disorder, recurrent, in partial remission: Secondary | ICD-10-CM | POA: Diagnosis not present

## 2016-05-16 DIAGNOSIS — Z1231 Encounter for screening mammogram for malignant neoplasm of breast: Secondary | ICD-10-CM | POA: Diagnosis not present

## 2016-06-13 DIAGNOSIS — F3341 Major depressive disorder, recurrent, in partial remission: Secondary | ICD-10-CM | POA: Diagnosis not present

## 2016-07-09 DIAGNOSIS — F3341 Major depressive disorder, recurrent, in partial remission: Secondary | ICD-10-CM | POA: Diagnosis not present

## 2016-07-10 DIAGNOSIS — R634 Abnormal weight loss: Secondary | ICD-10-CM | POA: Diagnosis not present

## 2016-07-10 DIAGNOSIS — D128 Benign neoplasm of rectum: Secondary | ICD-10-CM | POA: Diagnosis not present

## 2016-07-17 DIAGNOSIS — L570 Actinic keratosis: Secondary | ICD-10-CM | POA: Diagnosis not present

## 2016-07-17 DIAGNOSIS — I10 Essential (primary) hypertension: Secondary | ICD-10-CM | POA: Diagnosis not present

## 2016-07-17 DIAGNOSIS — E7800 Pure hypercholesterolemia, unspecified: Secondary | ICD-10-CM | POA: Diagnosis not present

## 2016-07-23 DIAGNOSIS — K5730 Diverticulosis of large intestine without perforation or abscess without bleeding: Secondary | ICD-10-CM | POA: Diagnosis not present

## 2016-07-23 DIAGNOSIS — K648 Other hemorrhoids: Secondary | ICD-10-CM | POA: Diagnosis not present

## 2016-07-23 DIAGNOSIS — Z79899 Other long term (current) drug therapy: Secondary | ICD-10-CM | POA: Diagnosis not present

## 2016-07-23 DIAGNOSIS — I10 Essential (primary) hypertension: Secondary | ICD-10-CM | POA: Diagnosis not present

## 2016-07-23 DIAGNOSIS — Z86010 Personal history of colonic polyps: Secondary | ICD-10-CM | POA: Diagnosis not present

## 2016-08-08 DIAGNOSIS — F3341 Major depressive disorder, recurrent, in partial remission: Secondary | ICD-10-CM | POA: Diagnosis not present

## 2016-10-08 DIAGNOSIS — F3341 Major depressive disorder, recurrent, in partial remission: Secondary | ICD-10-CM | POA: Diagnosis not present

## 2016-12-04 DIAGNOSIS — F3341 Major depressive disorder, recurrent, in partial remission: Secondary | ICD-10-CM | POA: Diagnosis not present

## 2017-01-16 DIAGNOSIS — Z79899 Other long term (current) drug therapy: Secondary | ICD-10-CM | POA: Diagnosis not present

## 2017-01-16 DIAGNOSIS — Z23 Encounter for immunization: Secondary | ICD-10-CM | POA: Diagnosis not present

## 2017-01-16 DIAGNOSIS — E7800 Pure hypercholesterolemia, unspecified: Secondary | ICD-10-CM | POA: Diagnosis not present

## 2017-01-16 DIAGNOSIS — I10 Essential (primary) hypertension: Secondary | ICD-10-CM | POA: Diagnosis not present

## 2017-01-16 DIAGNOSIS — L820 Inflamed seborrheic keratosis: Secondary | ICD-10-CM | POA: Diagnosis not present

## 2017-03-06 DIAGNOSIS — F3341 Major depressive disorder, recurrent, in partial remission: Secondary | ICD-10-CM | POA: Diagnosis not present

## 2017-04-09 DIAGNOSIS — E669 Obesity, unspecified: Secondary | ICD-10-CM | POA: Diagnosis not present

## 2017-04-09 DIAGNOSIS — K219 Gastro-esophageal reflux disease without esophagitis: Secondary | ICD-10-CM | POA: Diagnosis not present

## 2017-04-09 DIAGNOSIS — E7800 Pure hypercholesterolemia, unspecified: Secondary | ICD-10-CM | POA: Diagnosis not present

## 2017-04-09 DIAGNOSIS — M859 Disorder of bone density and structure, unspecified: Secondary | ICD-10-CM | POA: Diagnosis not present

## 2017-04-09 DIAGNOSIS — Z6831 Body mass index (BMI) 31.0-31.9, adult: Secondary | ICD-10-CM | POA: Diagnosis not present

## 2017-04-09 DIAGNOSIS — Z0000 Encounter for general adult medical examination without abnormal findings: Secondary | ICD-10-CM | POA: Diagnosis not present

## 2017-04-09 DIAGNOSIS — F339 Major depressive disorder, recurrent, unspecified: Secondary | ICD-10-CM | POA: Diagnosis not present

## 2017-04-09 DIAGNOSIS — I10 Essential (primary) hypertension: Secondary | ICD-10-CM | POA: Diagnosis not present

## 2017-05-15 DIAGNOSIS — F331 Major depressive disorder, recurrent, moderate: Secondary | ICD-10-CM | POA: Diagnosis not present

## 2017-05-21 DIAGNOSIS — F411 Generalized anxiety disorder: Secondary | ICD-10-CM | POA: Diagnosis not present

## 2017-05-21 DIAGNOSIS — F3341 Major depressive disorder, recurrent, in partial remission: Secondary | ICD-10-CM | POA: Diagnosis not present

## 2017-05-23 DIAGNOSIS — M8589 Other specified disorders of bone density and structure, multiple sites: Secondary | ICD-10-CM | POA: Diagnosis not present

## 2017-05-23 DIAGNOSIS — Z1231 Encounter for screening mammogram for malignant neoplasm of breast: Secondary | ICD-10-CM | POA: Diagnosis not present

## 2017-06-10 DIAGNOSIS — F3341 Major depressive disorder, recurrent, in partial remission: Secondary | ICD-10-CM | POA: Diagnosis not present

## 2017-06-17 DIAGNOSIS — F330 Major depressive disorder, recurrent, mild: Secondary | ICD-10-CM | POA: Diagnosis not present

## 2017-07-01 DIAGNOSIS — F3341 Major depressive disorder, recurrent, in partial remission: Secondary | ICD-10-CM | POA: Diagnosis not present

## 2017-07-29 DIAGNOSIS — F3341 Major depressive disorder, recurrent, in partial remission: Secondary | ICD-10-CM | POA: Diagnosis not present

## 2017-08-27 DIAGNOSIS — F3341 Major depressive disorder, recurrent, in partial remission: Secondary | ICD-10-CM | POA: Diagnosis not present

## 2017-09-24 DIAGNOSIS — F3341 Major depressive disorder, recurrent, in partial remission: Secondary | ICD-10-CM | POA: Diagnosis not present

## 2017-09-27 DIAGNOSIS — J302 Other seasonal allergic rhinitis: Secondary | ICD-10-CM | POA: Diagnosis not present

## 2017-10-10 IMAGING — RF DG ESOPHAGUS
19 of 24 series · 19 of 24 positions shown · non-contrast
Comparison: 07/05/2011.

CLINICAL DATA: History of gastric band procedure. Difficulty
swallowing.

EXAM:
ESOPHOGRAM / BARIUM SWALLOW / BARIUM TABLET STUDY
TECHNIQUE: Combined double contrast and single contrast examination performed
using effervescent crystals, thick barium liquid, and thin barium
liquid. The patient was observed with fluoroscopy swallowing a 13 mm
barium sulphate tablet.
FLUOROSCOPY TIME:  Radiation Exposure Index (as provided by the
fluoroscopic device):
If the device does not provide the exposure index:
Fluoroscopy Time:  2 minutes and 24 seconds.
Number of Acquired Images:

[Series 1: run · 1 of 14 slices shown (1 of 19)]
[im 1/14]
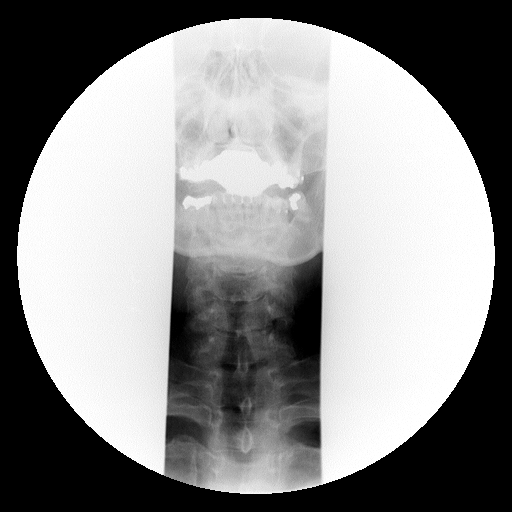

[Series 2: run · 1 of 11 slices shown (2 of 19)]
[im 1/11]
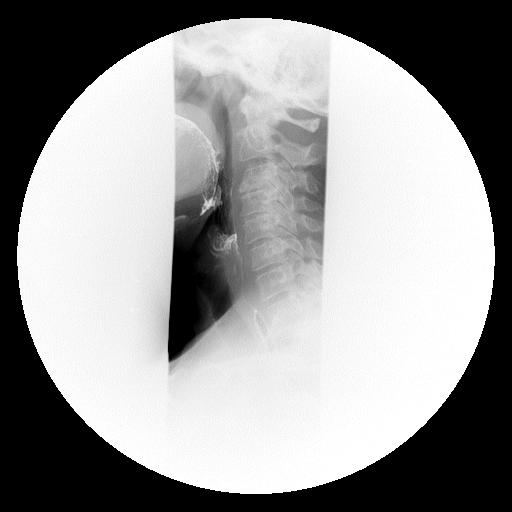

[Series 4: run · 1 of 1 slices shown (3 of 19)]
[im 1/1]
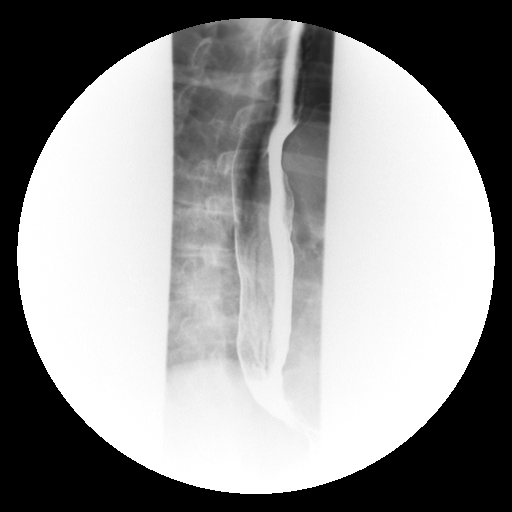

[Series 5: run · 1 of 1 slices shown (4 of 19)]
[im 1/1]
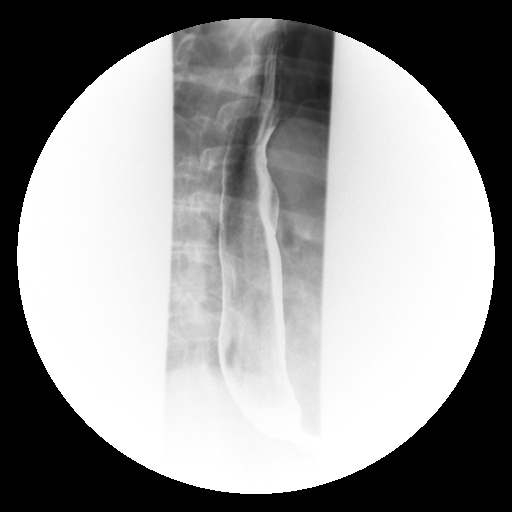

[Series 6: run · 1 of 1 slices shown (5 of 19)]
[im 1/1]
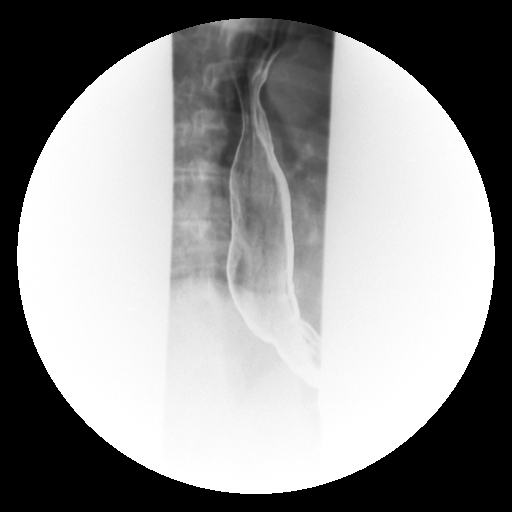

[Series 7: run · 1 of 1 slices shown (6 of 19)]
[im 1/1]
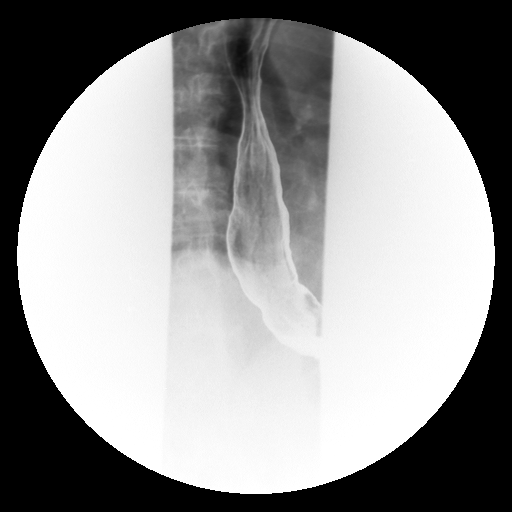

[Series 9: run · 1 of 1 slices shown (7 of 19)]
[im 1/1]
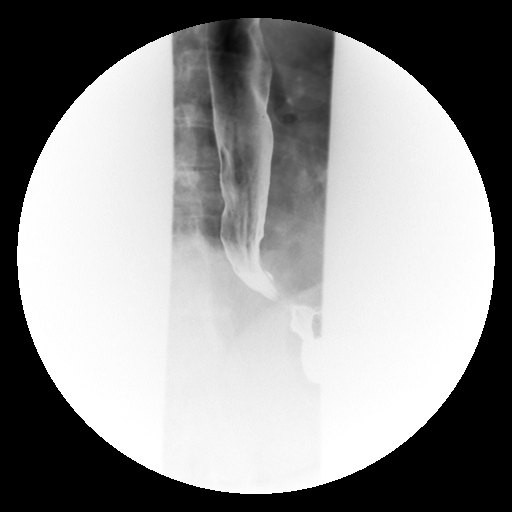

[Series 10: run · 1 of 1 slices shown (8 of 19)]
[im 1/1]
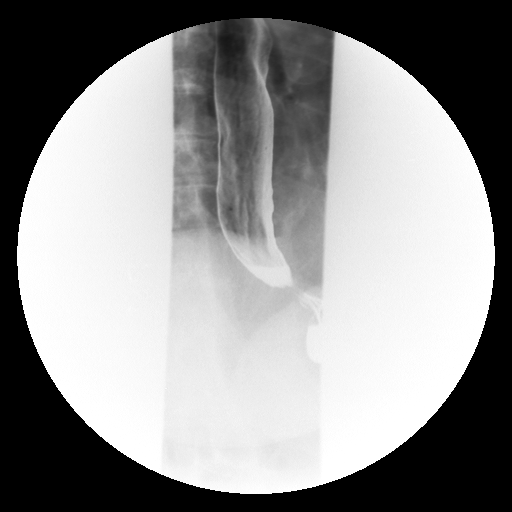

[Series 11: run · 1 of 1 slices shown (9 of 19)]
[im 1/1]
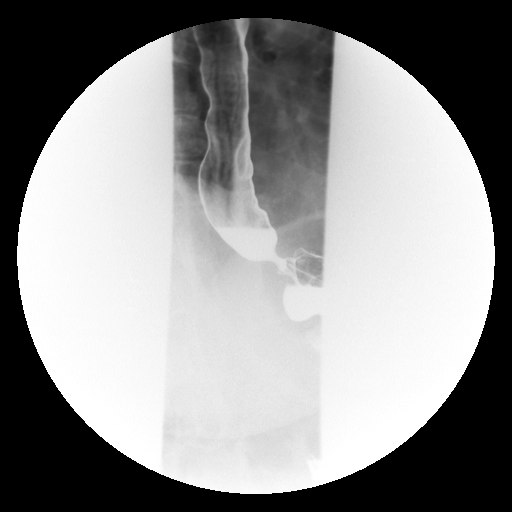

[Series 13: run · 1 of 1 slices shown (10 of 19)]
[im 1/1]
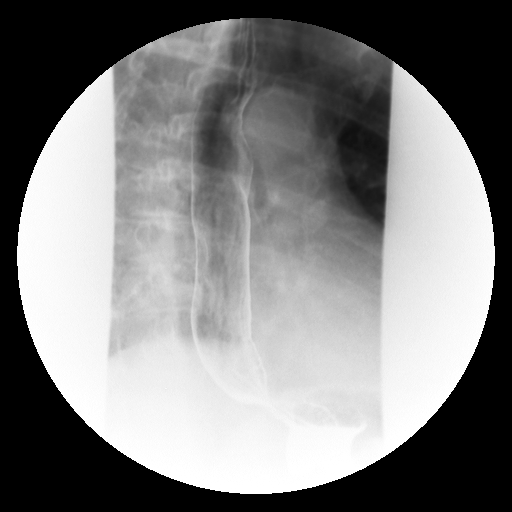

[Series 14: run · 1 of 1 slices shown (11 of 19)]
[im 1/1]
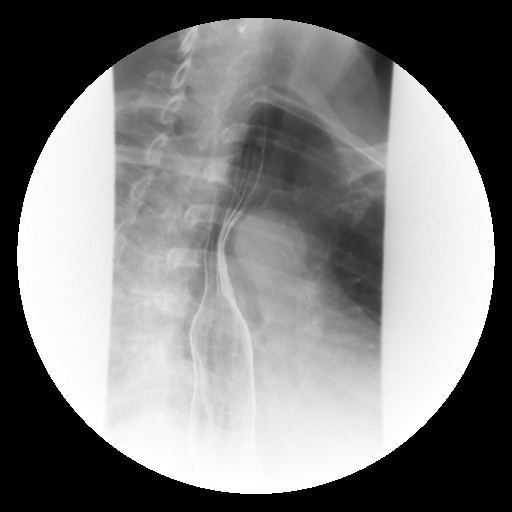

[Series 15: run · 1 of 1 slices shown (12 of 19)]
[im 1/1]
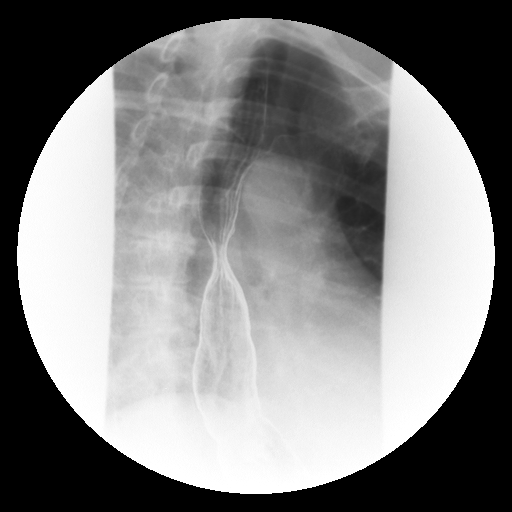

[Series 16: run · 1 of 1 slices shown (13 of 19)]
[im 1/1]
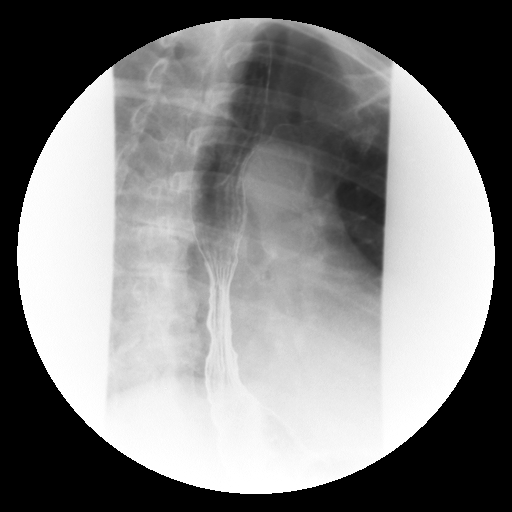

[Series 18: run · 1 of 1 slices shown (14 of 19)]
[im 1/1]
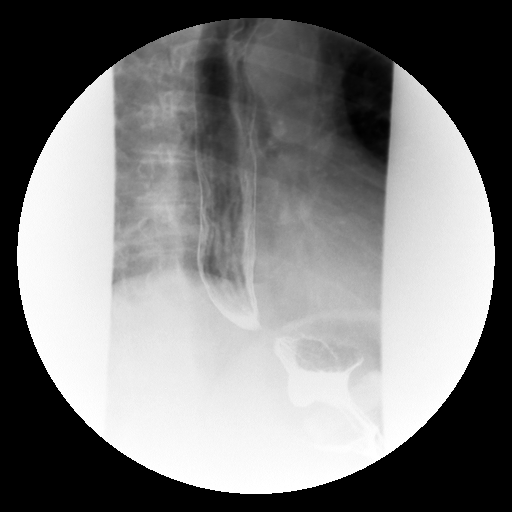

[Series 19: run · 1 of 1 slices shown (15 of 19)]
[im 1/1]
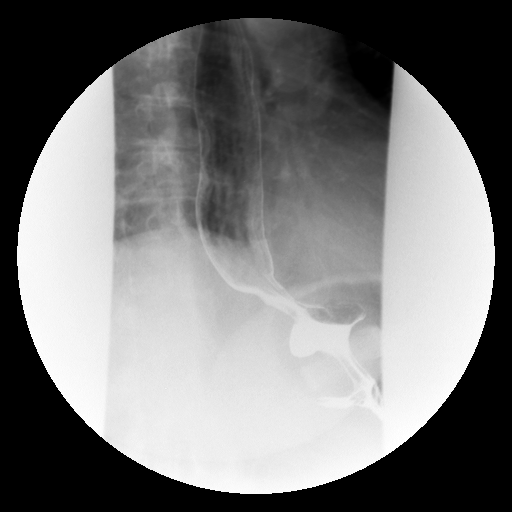

[Series 20: run · 1 of 1 slices shown (16 of 19)]
[im 1/1]
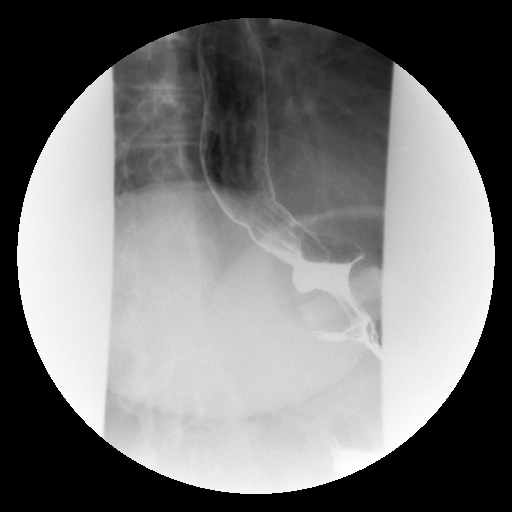

[Series 21: run · 1 of 1 slices shown (17 of 19)]
[im 1/1]
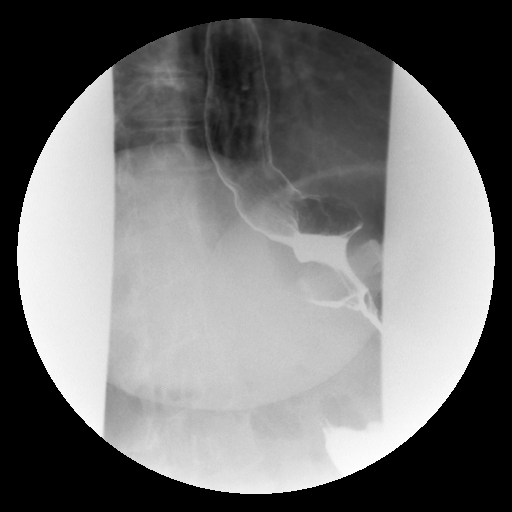

[Series 23: run · 1 of 1 slices shown (18 of 19)]
[im 1/1]
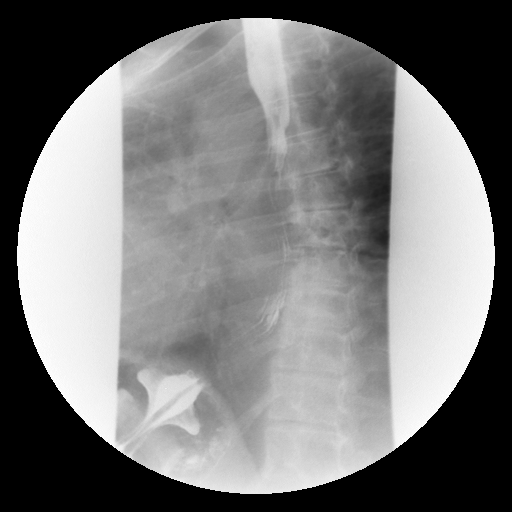

[Series 24: run · 1 of 1 slices shown (19 of 19)]
[im 1/1]
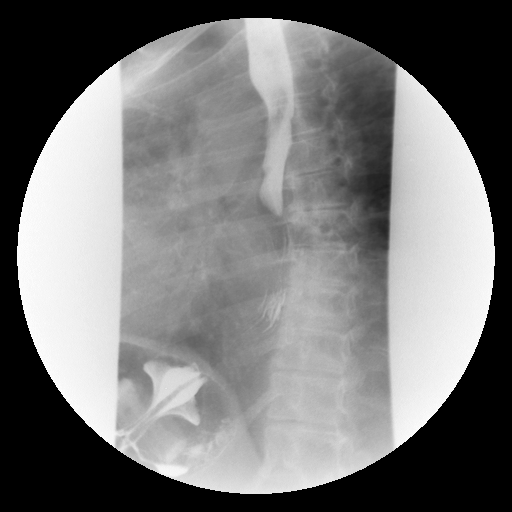

[19 of 24 positions shown; findings below may reference images not displayed]

FINDINGS: Preprocedure fluoroscopic evaluation of the proximal stomach reveals
appropriate lap band orientation.

Frontal and lateral views of the hypopharynx while swallowing are
normal. Double contrast imaging of the esophagus is normal. Primary
peristalsis is preserved. No substantial tertiary contractions are
presbyesophagus.

Contrast passage through the lap band is appropriate.

13 mm barium tablet passes readily into the stomach when taken with
water.
IMPRESSION: Normal double contrast barium esophagram in this patient status post
lap band procedure.

## 2017-10-16 DIAGNOSIS — F339 Major depressive disorder, recurrent, unspecified: Secondary | ICD-10-CM | POA: Diagnosis not present

## 2017-10-16 DIAGNOSIS — E663 Overweight: Secondary | ICD-10-CM | POA: Diagnosis not present

## 2017-10-16 DIAGNOSIS — Z6829 Body mass index (BMI) 29.0-29.9, adult: Secondary | ICD-10-CM | POA: Diagnosis not present

## 2017-10-16 DIAGNOSIS — E7800 Pure hypercholesterolemia, unspecified: Secondary | ICD-10-CM | POA: Diagnosis not present

## 2017-10-16 DIAGNOSIS — K219 Gastro-esophageal reflux disease without esophagitis: Secondary | ICD-10-CM | POA: Diagnosis not present

## 2017-10-16 DIAGNOSIS — I10 Essential (primary) hypertension: Secondary | ICD-10-CM | POA: Diagnosis not present

## 2017-10-22 DIAGNOSIS — F3341 Major depressive disorder, recurrent, in partial remission: Secondary | ICD-10-CM | POA: Diagnosis not present

## 2017-11-25 DIAGNOSIS — F3341 Major depressive disorder, recurrent, in partial remission: Secondary | ICD-10-CM | POA: Diagnosis not present

## 2017-12-24 DIAGNOSIS — F3341 Major depressive disorder, recurrent, in partial remission: Secondary | ICD-10-CM | POA: Diagnosis not present

## 2018-01-14 DIAGNOSIS — H04123 Dry eye syndrome of bilateral lacrimal glands: Secondary | ICD-10-CM | POA: Diagnosis not present

## 2018-02-24 DIAGNOSIS — Z23 Encounter for immunization: Secondary | ICD-10-CM | POA: Diagnosis not present

## 2018-02-24 DIAGNOSIS — F3342 Major depressive disorder, recurrent, in full remission: Secondary | ICD-10-CM | POA: Diagnosis not present

## 2018-02-25 DIAGNOSIS — D485 Neoplasm of uncertain behavior of skin: Secondary | ICD-10-CM | POA: Diagnosis not present

## 2018-04-23 DIAGNOSIS — F3340 Major depressive disorder, recurrent, in remission, unspecified: Secondary | ICD-10-CM | POA: Diagnosis not present

## 2018-04-23 DIAGNOSIS — I10 Essential (primary) hypertension: Secondary | ICD-10-CM | POA: Diagnosis not present

## 2018-04-23 DIAGNOSIS — E7800 Pure hypercholesterolemia, unspecified: Secondary | ICD-10-CM | POA: Diagnosis not present

## 2018-04-23 DIAGNOSIS — K219 Gastro-esophageal reflux disease without esophagitis: Secondary | ICD-10-CM | POA: Diagnosis not present

## 2018-04-23 DIAGNOSIS — E559 Vitamin D deficiency, unspecified: Secondary | ICD-10-CM | POA: Diagnosis not present

## 2018-04-23 DIAGNOSIS — E669 Obesity, unspecified: Secondary | ICD-10-CM | POA: Diagnosis not present

## 2018-04-23 DIAGNOSIS — Z0000 Encounter for general adult medical examination without abnormal findings: Secondary | ICD-10-CM | POA: Diagnosis not present

## 2018-04-23 DIAGNOSIS — Z6830 Body mass index (BMI) 30.0-30.9, adult: Secondary | ICD-10-CM | POA: Diagnosis not present

## 2018-05-12 DIAGNOSIS — F3341 Major depressive disorder, recurrent, in partial remission: Secondary | ICD-10-CM | POA: Diagnosis not present

## 2018-06-30 DIAGNOSIS — Z1231 Encounter for screening mammogram for malignant neoplasm of breast: Secondary | ICD-10-CM | POA: Diagnosis not present

## 2018-08-24 DIAGNOSIS — F3341 Major depressive disorder, recurrent, in partial remission: Secondary | ICD-10-CM | POA: Diagnosis not present

## 2018-10-07 DIAGNOSIS — M7541 Impingement syndrome of right shoulder: Secondary | ICD-10-CM | POA: Diagnosis not present

## 2018-10-07 DIAGNOSIS — M25511 Pain in right shoulder: Secondary | ICD-10-CM | POA: Diagnosis not present

## 2018-10-23 DIAGNOSIS — E7800 Pure hypercholesterolemia, unspecified: Secondary | ICD-10-CM | POA: Diagnosis not present

## 2018-10-23 DIAGNOSIS — I10 Essential (primary) hypertension: Secondary | ICD-10-CM | POA: Diagnosis not present

## 2018-10-23 DIAGNOSIS — E663 Overweight: Secondary | ICD-10-CM | POA: Diagnosis not present

## 2018-10-23 DIAGNOSIS — Z6829 Body mass index (BMI) 29.0-29.9, adult: Secondary | ICD-10-CM | POA: Diagnosis not present

## 2018-10-23 DIAGNOSIS — F3341 Major depressive disorder, recurrent, in partial remission: Secondary | ICD-10-CM | POA: Diagnosis not present

## 2018-12-31 DIAGNOSIS — F3341 Major depressive disorder, recurrent, in partial remission: Secondary | ICD-10-CM | POA: Diagnosis not present

## 2019-01-14 DIAGNOSIS — F3341 Major depressive disorder, recurrent, in partial remission: Secondary | ICD-10-CM | POA: Diagnosis not present

## 2019-01-20 DIAGNOSIS — H04123 Dry eye syndrome of bilateral lacrimal glands: Secondary | ICD-10-CM | POA: Diagnosis not present

## 2019-02-17 DIAGNOSIS — Z23 Encounter for immunization: Secondary | ICD-10-CM | POA: Diagnosis not present

## 2019-03-24 DIAGNOSIS — M19021 Primary osteoarthritis, right elbow: Secondary | ICD-10-CM | POA: Diagnosis not present

## 2019-05-10 DIAGNOSIS — Z03818 Encounter for observation for suspected exposure to other biological agents ruled out: Secondary | ICD-10-CM | POA: Diagnosis not present

## 2019-08-24 DIAGNOSIS — Z1231 Encounter for screening mammogram for malignant neoplasm of breast: Secondary | ICD-10-CM | POA: Diagnosis not present

## 2019-09-28 DIAGNOSIS — Z6829 Body mass index (BMI) 29.0-29.9, adult: Secondary | ICD-10-CM | POA: Diagnosis not present

## 2019-09-28 DIAGNOSIS — R21 Rash and other nonspecific skin eruption: Secondary | ICD-10-CM | POA: Diagnosis not present

## 2019-10-20 ENCOUNTER — Encounter: Payer: Self-pay | Admitting: Sports Medicine

## 2019-10-20 ENCOUNTER — Ambulatory Visit (INDEPENDENT_AMBULATORY_CARE_PROVIDER_SITE_OTHER): Payer: Medicare Other | Admitting: Sports Medicine

## 2019-10-20 ENCOUNTER — Other Ambulatory Visit: Payer: Self-pay

## 2019-10-20 ENCOUNTER — Other Ambulatory Visit: Payer: Self-pay | Admitting: Sports Medicine

## 2019-10-20 DIAGNOSIS — L603 Nail dystrophy: Secondary | ICD-10-CM

## 2019-10-20 DIAGNOSIS — L989 Disorder of the skin and subcutaneous tissue, unspecified: Secondary | ICD-10-CM

## 2019-10-20 DIAGNOSIS — L84 Corns and callosities: Secondary | ICD-10-CM

## 2019-10-20 DIAGNOSIS — M79672 Pain in left foot: Secondary | ICD-10-CM | POA: Diagnosis not present

## 2019-10-20 DIAGNOSIS — M79671 Pain in right foot: Secondary | ICD-10-CM | POA: Diagnosis not present

## 2019-10-20 NOTE — Progress Notes (Signed)
Subjective: Amy Butler is a 74 y.o. female patient who presents to office for evaluation of  Left greater than right foot pain secondary to callus skin first toes and also at left second toe reports that the nail is very thick after stopping it over and over for several years and reports that even when she is lying in bed in the covers touch the toe or her calluses the area hurts denies any open lesion swelling redness warmth or drainage reports that she has had these issues for years with calluses and has went for pedicures not only temporarily help.  Patient denies any other pedal complaints.   Review of Systems  All other systems reviewed and are negative.   Patient Active Problem List   Diagnosis Date Noted  . Esophageal reflux 11/02/2015  . Effects of hunger 10/14/2013  . Morbid obesity ([place]) 10/14/2013  . 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 bisoprolol-hydrochlorothiazide (ZIAC) 2.5-6.25 MG tablet Take 1 tablet by mouth daily.    . Calcium Citrate-Vitamin D3 1000-400 LIQD Take by mouth.    . clonazePAM (KLONOPIN) 0.5 MG tablet Take 0.5 mg by mouth 2 (two) times daily as needed for anxiety.    . famotidine (PEPCID) 40 MG/5ML suspension Take by mouth daily.    . pravastatin (PRAVACHOL) 80 MG tablet Take 80 mg by mouth daily.    . QUEtiapine (SEROQUEL) 100 MG tablet Take 100 mg by mouth at bedtime.    . sertraline (ZOLOFT) 50 MG tablet Take 50 mg by mouth daily.    . vitamin B-12 (CYANOCOBALAMIN) 1000 MCG tablet Take 1,000 mcg by mouth daily.     No current facility-administered medications on file prior to visit.    Allergies  Allergen Reactions  . Penicillins Other (See Comments)  . Gabapentin Rash    Objective:  General: Alert and oriented x3 in no acute distress  Dermatology: Keratotic lesion present medial hallux bilateral with skin lines transversing  the lesions, pain is present with direct pressure to the lesion with a central nucleated core noted, no webspace macerations, no ecchymosis bilateral, all nails x 10 are well manicured except left second toe where there is significant thickness.  Vascular: Dorsalis Pedis and Posterior Tibial pedal pulses 1/4, Capillary Fill Time 3 seconds, + pedal hair growth bilateral, no edema bilateral lower extremities, Temperature gradient within normal limits.  Neurology: Johney Maine sensation intact via light touch bilateral.  Musculoskeletal: Mild tenderness with palpation at the keratotic lesion site site and left second toe.  Long toe and hammertoe boney deformity noted.  Assessment and Plan: Problem List Items Addressed This Visit    None    Visit Diagnoses    Callus of foot    -  Primary   Benign skin lesion       Bilateral foot pain       Nail dystrophy          -Complete examination performed -Discussed treatment options for callus and nail issue -Parred keratoic lesions x2 using a chisel blade; treated the area withSalinocaine covered with moleskin -Encouraged daily skin emollients and dispensed a sample of foot miracle cream to use the callus areas -Encouraged use of pumice stone -Advised good supportive shoes and inserts for foot type -At no additional charge mechanically debrided left second toenail using a sterile nail nipper without incident and advised patient to file daily and may try  to use Vicks VapoRub to the toenail to help as well as to be careful not to wear shoes that are too tight that could rub the toes -Dispensed toe caps for patient to use as instructed bilateral -Patient to return to office as needed or sooner if condition worsens.  Landis Martins, DPM

## 2020-02-25 DIAGNOSIS — H43813 Vitreous degeneration, bilateral: Secondary | ICD-10-CM | POA: Diagnosis not present

## 2020-03-01 DIAGNOSIS — Z23 Encounter for immunization: Secondary | ICD-10-CM | POA: Diagnosis not present

## 2020-03-08 DIAGNOSIS — D225 Melanocytic nevi of trunk: Secondary | ICD-10-CM | POA: Diagnosis not present

## 2020-03-08 DIAGNOSIS — D1801 Hemangioma of skin and subcutaneous tissue: Secondary | ICD-10-CM | POA: Diagnosis not present

## 2020-03-08 DIAGNOSIS — L814 Other melanin hyperpigmentation: Secondary | ICD-10-CM | POA: Diagnosis not present

## 2020-03-08 DIAGNOSIS — L820 Inflamed seborrheic keratosis: Secondary | ICD-10-CM | POA: Diagnosis not present

## 2020-03-08 DIAGNOSIS — D2239 Melanocytic nevi of other parts of face: Secondary | ICD-10-CM | POA: Diagnosis not present

## 2020-04-05 DIAGNOSIS — D485 Neoplasm of uncertain behavior of skin: Secondary | ICD-10-CM | POA: Diagnosis not present

## 2020-04-26 ENCOUNTER — Other Ambulatory Visit: Payer: Self-pay

## 2020-04-26 ENCOUNTER — Ambulatory Visit (INDEPENDENT_AMBULATORY_CARE_PROVIDER_SITE_OTHER): Payer: Medicare Other | Admitting: Sports Medicine

## 2020-04-26 ENCOUNTER — Encounter: Payer: Self-pay | Admitting: Sports Medicine

## 2020-04-26 DIAGNOSIS — L84 Corns and callosities: Secondary | ICD-10-CM | POA: Diagnosis not present

## 2020-04-26 DIAGNOSIS — F3341 Major depressive disorder, recurrent, in partial remission: Secondary | ICD-10-CM | POA: Diagnosis not present

## 2020-04-26 DIAGNOSIS — L603 Nail dystrophy: Secondary | ICD-10-CM

## 2020-04-26 DIAGNOSIS — M79672 Pain in left foot: Secondary | ICD-10-CM | POA: Diagnosis not present

## 2020-04-26 DIAGNOSIS — M79671 Pain in right foot: Secondary | ICD-10-CM | POA: Diagnosis not present

## 2020-04-26 DIAGNOSIS — M2040 Other hammer toe(s) (acquired), unspecified foot: Secondary | ICD-10-CM | POA: Diagnosis not present

## 2020-04-26 DIAGNOSIS — M859 Disorder of bone density and structure, unspecified: Secondary | ICD-10-CM | POA: Diagnosis not present

## 2020-04-26 DIAGNOSIS — I10 Essential (primary) hypertension: Secondary | ICD-10-CM | POA: Diagnosis not present

## 2020-04-26 DIAGNOSIS — E559 Vitamin D deficiency, unspecified: Secondary | ICD-10-CM | POA: Diagnosis not present

## 2020-04-26 DIAGNOSIS — L989 Disorder of the skin and subcutaneous tissue, unspecified: Secondary | ICD-10-CM

## 2020-04-26 DIAGNOSIS — Z6829 Body mass index (BMI) 29.0-29.9, adult: Secondary | ICD-10-CM | POA: Diagnosis not present

## 2020-04-26 DIAGNOSIS — E7800 Pure hypercholesterolemia, unspecified: Secondary | ICD-10-CM | POA: Diagnosis not present

## 2020-04-26 DIAGNOSIS — K219 Gastro-esophageal reflux disease without esophagitis: Secondary | ICD-10-CM | POA: Diagnosis not present

## 2020-04-26 DIAGNOSIS — Z79899 Other long term (current) drug therapy: Secondary | ICD-10-CM | POA: Diagnosis not present

## 2020-04-26 DIAGNOSIS — Z0000 Encounter for general adult medical examination without abnormal findings: Secondary | ICD-10-CM | POA: Diagnosis not present

## 2020-04-26 MED ORDER — LIDOCAINE 5 % EX OINT
1.0000 | TOPICAL_OINTMENT | CUTANEOUS | 0 refills | Status: DC | PRN
Start: 2020-04-26 — End: 2020-08-09

## 2020-04-26 NOTE — Progress Notes (Signed)
Subjective: Shaune Malacara is a 74 y.o. female patient who returns office for follow-up evaluation of left foot pain secondary to callus skin and thick toenail at left first toe.  Patient reports that she trimmed off the loose portions of her left big toenail and reports that it did help but has some pain when in shoes it feels tight around her nail denies any significant redness warmth swelling drainage around toe or skin around toenail.  Patient also admits that on her left fifth toe that there is some hard skin and that she is having a difficult time fitting in shoes comfortably.  Patient denies any other pedal complaints.   Patient Active Problem List   Diagnosis Date Noted  . Esophageal reflux 11/02/2015  . Effects of hunger 10/14/2013  . Morbid obesity ([place]) 10/14/2013  . 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 bisoprolol-hydrochlorothiazide (ZIAC) 2.5-6.25 MG tablet Take 1 tablet by mouth daily.    . Calcium Citrate-Vitamin D3 1000-400 LIQD Take by mouth.    . clonazePAM (KLONOPIN) 0.5 MG tablet Take 0.5 mg by mouth 2 (two) times daily as needed for anxiety.    . famotidine (PEPCID) 40 MG tablet Take 40 mg by mouth daily.    . famotidine (PEPCID) 40 MG/5ML suspension Take by mouth daily.    . fluocinonide cream (LIDEX) 0.05 % SMARTSIG:1 Topical Every Night    . pravastatin (PRAVACHOL) 80 MG tablet Take 80 mg by mouth daily.    . QUEtiapine (SEROQUEL) 100 MG tablet Take 100 mg by mouth at bedtime.    . sertraline (ZOLOFT) 100 MG tablet Take 100 mg by mouth daily.    . sertraline (ZOLOFT) 50 MG tablet Take 50 mg by mouth daily.    . vitamin B-12 (CYANOCOBALAMIN) 1000 MCG tablet Take 1,000 mcg by mouth daily.     No current facility-administered medications on file prior to visit.    Allergies  Allergen Reactions  . Penicillins Other (See Comments)  . Gabapentin Rash     Objective:  General: Alert and oriented x3 in no acute distress  Dermatology: Keratotic lesion present very minimal in nature to the left fifth toe dorsal aspect.  There is thickening noted to all nails but they are well trimmed patient recently trim left hallux nail there is no acute signs of ingrowing noted.  Vascular: Dorsalis Pedis and Posterior Tibial pedal pulses 1/4, Capillary Fill Time 3 seconds, + pedal hair growth bilateral, no edema bilateral lower extremities, Temperature gradient within normal limits.  Neurology: Johney Maine sensation intact via light touch bilateral.  Musculoskeletal: Mild tenderness with palpation at the keratotic lesion site left fifth toe.  Long toe and hammertoe boney deformity noted.  Assessment and Plan: Problem List Items Addressed This Visit   None   Visit Diagnoses    Callus of foot    -  Primary   Benign skin lesion       Hammer toe, unspecified laterality       Nail dystrophy       Bilateral foot pain          -Complete examination performed -Discussed treatment options for callus and nail issue -Advised patient to give time slowly for her toenails to grow out if they become sore painful may continue with keeping them trimmed and file or use of tea tree oil to the nail -Prescribe lidocaine cream for patient to  use as needed for any pain to the toes -At no additional charge mechanically debrided minimal amount of keratosis at the left fifth toe using a sterile chisel blade and then applied a toe cap to the area and advised patient to do the same when in shoe to prevent rubbing -Encouraged patient to choose shoes that are good and supportive for foot type and to avoid shoes that may be too big that could cause increased rubbing -Return to office as needed or if symptoms fail to continue to improve.  Landis Martins, DPM

## 2020-05-17 DIAGNOSIS — M8589 Other specified disorders of bone density and structure, multiple sites: Secondary | ICD-10-CM | POA: Diagnosis not present

## 2020-05-17 DIAGNOSIS — M85852 Other specified disorders of bone density and structure, left thigh: Secondary | ICD-10-CM | POA: Diagnosis not present

## 2020-06-29 DIAGNOSIS — J069 Acute upper respiratory infection, unspecified: Secondary | ICD-10-CM | POA: Diagnosis not present

## 2020-06-29 DIAGNOSIS — Z1152 Encounter for screening for COVID-19: Secondary | ICD-10-CM | POA: Diagnosis not present

## 2020-07-06 DIAGNOSIS — D225 Melanocytic nevi of trunk: Secondary | ICD-10-CM | POA: Diagnosis not present

## 2020-07-06 DIAGNOSIS — L820 Inflamed seborrheic keratosis: Secondary | ICD-10-CM | POA: Diagnosis not present

## 2020-08-03 DIAGNOSIS — M25512 Pain in left shoulder: Secondary | ICD-10-CM | POA: Diagnosis not present

## 2020-08-03 DIAGNOSIS — R079 Chest pain, unspecified: Secondary | ICD-10-CM | POA: Diagnosis not present

## 2020-08-09 ENCOUNTER — Encounter: Payer: Self-pay | Admitting: Cardiology

## 2020-08-09 ENCOUNTER — Encounter: Payer: Self-pay | Admitting: *Deleted

## 2020-08-09 DIAGNOSIS — E785 Hyperlipidemia, unspecified: Secondary | ICD-10-CM | POA: Insufficient documentation

## 2020-08-09 DIAGNOSIS — K219 Gastro-esophageal reflux disease without esophagitis: Secondary | ICD-10-CM | POA: Insufficient documentation

## 2020-08-09 DIAGNOSIS — F329 Major depressive disorder, single episode, unspecified: Secondary | ICD-10-CM | POA: Insufficient documentation

## 2020-08-09 DIAGNOSIS — F429 Obsessive-compulsive disorder, unspecified: Secondary | ICD-10-CM | POA: Insufficient documentation

## 2020-08-09 DIAGNOSIS — Z789 Other specified health status: Secondary | ICD-10-CM | POA: Insufficient documentation

## 2020-08-09 DIAGNOSIS — K589 Irritable bowel syndrome without diarrhea: Secondary | ICD-10-CM | POA: Insufficient documentation

## 2020-08-09 DIAGNOSIS — K5790 Diverticulosis of intestine, part unspecified, without perforation or abscess without bleeding: Secondary | ICD-10-CM | POA: Insufficient documentation

## 2020-08-09 DIAGNOSIS — Z7409 Other reduced mobility: Secondary | ICD-10-CM | POA: Insufficient documentation

## 2020-08-09 DIAGNOSIS — L309 Dermatitis, unspecified: Secondary | ICD-10-CM | POA: Insufficient documentation

## 2020-08-09 DIAGNOSIS — I10 Essential (primary) hypertension: Secondary | ICD-10-CM | POA: Insufficient documentation

## 2020-08-10 ENCOUNTER — Ambulatory Visit (INDEPENDENT_AMBULATORY_CARE_PROVIDER_SITE_OTHER): Payer: Medicare Other | Admitting: Cardiology

## 2020-08-10 ENCOUNTER — Encounter: Payer: Self-pay | Admitting: Cardiology

## 2020-08-10 ENCOUNTER — Other Ambulatory Visit: Payer: Self-pay

## 2020-08-10 VITALS — BP 118/76 | HR 72 | Ht 62.0 in | Wt 163.4 lb

## 2020-08-10 DIAGNOSIS — E8881 Metabolic syndrome: Secondary | ICD-10-CM | POA: Diagnosis not present

## 2020-08-10 DIAGNOSIS — R011 Cardiac murmur, unspecified: Secondary | ICD-10-CM

## 2020-08-10 DIAGNOSIS — E785 Hyperlipidemia, unspecified: Secondary | ICD-10-CM

## 2020-08-10 DIAGNOSIS — R9431 Abnormal electrocardiogram [ECG] [EKG]: Secondary | ICD-10-CM

## 2020-08-10 DIAGNOSIS — R072 Precordial pain: Secondary | ICD-10-CM

## 2020-08-10 DIAGNOSIS — R0789 Other chest pain: Secondary | ICD-10-CM

## 2020-08-10 DIAGNOSIS — R0602 Shortness of breath: Secondary | ICD-10-CM

## 2020-08-10 LAB — LIPID PANEL
Chol/HDL Ratio: 4.4 ratio (ref 0.0–4.4)
Cholesterol, Total: 218 mg/dL — ABNORMAL HIGH (ref 100–199)
HDL: 50 mg/dL (ref >39)
LDL Chol Calc (NIH): 136 mg/dL — ABNORMAL HIGH (ref 0–99)
Triglycerides: 178 mg/dL — ABNORMAL HIGH (ref 0–149)
VLDL Cholesterol Cal: 32 mg/dL (ref 5–40)

## 2020-08-10 MED ORDER — NITROGLYCERIN 0.4 MG SL SUBL: 0.4000 mg | SUBLINGUAL_TABLET | SUBLINGUAL | 1 refills | Status: AC | PRN

## 2020-08-10 MED ORDER — METOPROLOL TARTRATE 100 MG PO TABS: ORAL_TABLET | ORAL | 0 refills | Status: DC

## 2020-08-10 NOTE — Patient Instructions (Addendum)
Medication Instructions:  Your physician has recommended you make the following change in your medication:  START: Nitroglycerin 0.4 mg take one tablet by mouth every 5 minutes up to three times as needed for chest pain.  *If you need a refill on your cardiac medications before your next appointment, please call your pharmacy*   Lab Work: Your physician recommends that you return for lab work: TODAY: Lipids 3-7 days before CT: BMET, Mag If you have labs (blood work) drawn today and your tests are completely normal, you will receive your results only by: Marland Kitchen MyChart Message (if you have MyChart) OR . A paper copy in the mail If you have any lab test that is abnormal or we need to change your treatment, we will call you to review the results.   Testing/Procedures: Your physician has requested that you have an echocardiogram. Echocardiography is a painless test that uses sound waves to create images of your heart. It provides your doctor with information about the size and shape of your heart and how well your heart's chambers and valves are working. This procedure takes approximately one hour. There are no restrictions for this procedure.  Your cardiac CT will be scheduled at:  [facility] [address] [place], [place] [address] [phone]  If scheduled at [facility], please arrive at the [facility] main entrance (entrance A) of [facility] 30 minutes prior to test start time. Proceed to the [facility] Radiology Department (first floor) to check-in and test prep.   Please follow these instructions carefully (unless otherwise directed):  On the Night Before the Test: . Be sure to Drink plenty of water. . Do not consume any caffeinated/decaffeinated beverages or chocolate 12 hours prior to your test. . Do not take any antihistamines 12 hours prior to your test.  On the Day of the Test: . Drink plenty of water until 1 hour prior to the test. . Do  not eat any food 4 hours prior to the test. . You may take your regular medications prior to the test.  . Take metoprolol (Lopressor) two hours prior to test. . HOLD Hydrochlorothiazide (Ziac) morning of the test. . FEMALES- please wear underwire-free bra if available      After the Test: . Drink plenty of water. . After receiving IV contrast, you may experience a mild flushed feeling. This is normal. . On occasion, you may experience a mild rash up to 24 hours after the test. This is not dangerous. If this occurs, you can take Benadryl 25 mg and increase your fluid intake. . If you experience trouble breathing, this can be serious. If it is severe call 911 IMMEDIATELY. If it is mild, please call our office. . If you take any of these medications: Glipizide/Metformin, Avandament, Glucavance, please do not take 48 hours after completing test unless otherwise instructed.   Once we have confirmed authorization from your insurance company, we will call you to set up a date and time for your test. Based on how quickly your insurance processes prior authorizations requests, please allow up to 4 weeks to be contacted for scheduling your Cardiac CT appointment. Be advised that routine Cardiac CT appointments could be scheduled as many as 8 weeks after your provider has ordered it.  For non-scheduling related questions, please contact the cardiac imaging nurse navigator should you have any questions/concerns: Marchia Bond, Cardiac Imaging Nurse Navigator Gordy Clement, Cardiac Imaging Nurse Navigator [facility]  Direct Office Dial: [phone]   For scheduling needs, including cancellations and rescheduling, please call [place], [phone].     Follow-Up: At [facility], you and your health needs are our priority.  As part of our continuing mission to provide you with exceptional heart care, we have created designated Provider Care Teams.  These Care Teams  include your primary Cardiologist (physician) and Advanced Practice Providers (APPs -  Physician Assistants and Nurse Practitioners) who all work together to provide you with the care you need, when you need it.  We recommend signing up for the patient portal called "MyChart".  Sign up information is provided on this After Visit Summary.  MyChart is used to connect with patients for Virtual Visits (Telemedicine).  Patients are able to view lab/test results, encounter notes, upcoming appointments, etc.  Non-urgent messages can be sent to your provider as well.   To learn more about what you can do with MyChart, go to [email or web address].    Your next appointment:   3 month(s)  The format for your next appointment:   In Person  Provider:   Berniece Salines, DO   Other Instructions  Echocardiogram An echocardiogram is a test that uses sound waves (ultrasound) to produce images of the heart. Images from an echocardiogram can provide important information about:  Heart size and shape.  The size and thickness and movement of your heart's walls.  Heart muscle function and strength.  Heart valve function or if you have stenosis. Stenosis is when the heart valves are too narrow.  If blood is flowing backward through the heart valves (regurgitation).  A tumor or infectious growth around the heart valves.  Areas of heart muscle that are not working well because of poor blood flow or injury from a heart attack.  Aneurysm detection. An aneurysm is a weak or damaged part of an artery wall. The wall bulges out from the normal force of blood pumping through the body. Tell a health care provider about:  Any allergies you have.  All medicines you are taking, including vitamins, herbs, eye drops, creams, and over-the-counter medicines.  Any blood disorders you have.  Any surgeries you have had.  Any medical conditions you have.  Whether you are pregnant or may be pregnant. What are the  risks? Generally, this is a safe test. However, problems may occur, including an allergic reaction to dye (contrast) that may be used during the test. What happens before the test? No specific preparation is needed. You may eat and drink normally. What happens during the test?  You will take off your clothes from the waist up and put on a hospital gown.  Electrodes or electrocardiogram (ECG)patches may be placed on your chest. The electrodes or patches are then connected to a device that monitors your heart rate and rhythm.  You will lie down on a table for an ultrasound exam. A gel will be applied to your chest to help sound waves pass through your skin.  A handheld device, called a transducer, will be pressed against your chest and moved over your heart. The transducer produces sound waves that travel to your heart and bounce back (or "echo" back) to the transducer. These sound waves will be captured in real-time and changed into images of your heart that can be viewed on a video monitor. The images will be recorded on a computer and reviewed by your health care provider.  You may be asked to change positions or hold your breath for a  short time. This makes it easier to get different views or better views of your heart.  In some cases, you may receive contrast through an IV in one of your veins. This can improve the quality of the pictures from your heart. The procedure may vary among health care providers and hospitals.   What can I expect after the test? You may return to your normal, everyday life, including diet, activities, and medicines, unless your health care provider tells you not to do that. Follow these instructions at home:  It is up to you to get the results of your test. Ask your health care provider, or the department that is doing the test, when your results will be ready.  Keep all follow-up visits. This is important. Summary  An echocardiogram is a test that uses sound  waves (ultrasound) to produce images of the heart.  Images from an echocardiogram can provide important information about the size and shape of your heart, heart muscle function, heart valve function, and other possible heart problems.  You do not need to do anything to prepare before this test. You may eat and drink normally.  After the echocardiogram is completed, you may return to your normal, everyday life, unless your health care provider tells you not to do that. This information is not intended to replace advice given to you by your health care provider. Make sure you discuss any questions you have with your health care provider. Document Revised: 12/14/2019 Document Reviewed: 12/14/2019 Elsevier Patient Education  2021 [employer].

## 2020-08-10 NOTE — Progress Notes (Signed)
Cardiology Office Note:    Date:  08/10/2020   ID:  Amy Butler, DOB 1946/03/17, MRN [id number]  PCP:  Greig Right, MD  Cardiologist:  Berniece Salines, DO  Electrophysiologist:  None   Referring MD: Esperanza Richters, NP   Have had some chest pain  History of Present Illness:    Amy Butler is a 75 y.o. female with a hx of hypertension, hyperlipidemia, obesity status post lap band is here today to be evaluated at the request of her primary care provider.  The patient tells me that over the last several months she has had intermittent midsternal chest heaviness.  She notes that sometimes it starts on the left side.  Was concerned recently his asthma she feels this heaviness/burning sensation at times she feels as if it is radiating to her jaw.  It usually lasting less than 5 minutes.  It is intermittent.  Nothing makes it better or worse.  She also endorses the fact that she experiencing worsening shortness of breath on the retorts that she is to do at home.  She is concerned about this as vacuuming was fine and now has really great deal of shortness of breath after she vacuums.  She denies any lightheadedness or any dizziness.  Past Medical History:  Diagnosis Date  . Dermatitis   . Diverticulosis   . Effects of hunger 10/14/2013  . Esophageal reflux 11/02/2015  . GERD (gastroesophageal reflux disease)   . History of laparoscopic adjustable gastric banding 11/29/2005  . Hyperlipidemia   . Hypertension   . IBS (irritable bowel syndrome)   . Impaired mobility and ADLs   . Major depression   . Morbid obesity ([place]) 10/14/2013  . Obsessive compulsive disorder     Past Surgical History:  Procedure Laterality Date  . LAMINECTOMY     cervical region  . LAPAROSCOPIC GASTRIC BANDING  2007  . REFRACTIVE SURGERY    . THYROIDECTOMY, PARTIAL    . TUBAL LIGATION      Current Medications: Current Meds  Medication Sig  . bisoprolol-hydrochlorothiazide (ZIAC) 2.5-6.25 MG tablet Take 1  tablet by mouth daily. Take 1/2 tablet daily  . Cholecalciferol (VITAMIN D3) 25 MCG (1000 UT) CAPS Take 1,000 Units by mouth daily.  . clonazePAM (KLONOPIN) 0.5 MG tablet Take 0.5 mg by mouth 2 (two) times daily as needed for anxiety.  . famotidine (PEPCID) 40 MG tablet Take 40 mg by mouth daily.  . metoprolol tartrate (LOPRESSOR) 100 MG tablet Take 2 hours prior to CT  . nitroGLYCERIN (NITROSTAT) 0.4 MG SL tablet Place 1 tablet (0.4 mg total) under the tongue every 5 (five) minutes as needed for chest pain. Up to three times.  . pravastatin (PRAVACHOL) 80 MG tablet Take 80 mg by mouth daily.  . QUEtiapine (SEROQUEL) 100 MG tablet Take 200 mg by mouth at bedtime.  . sertraline (ZOLOFT) 100 MG tablet Take 100 mg by mouth daily.     Allergies:   Crestor [rosuvastatin], Penicillins, and Gabapentin   Social History   Socioeconomic History  . Marital status: Married    Spouse name: Not on file  . Number of children: Not on file  . Years of education: Not on file  . Highest education level: Not on file  Occupational History  . Not on file  Tobacco Use  . Smoking status: Never Smoker  . Smokeless tobacco: Not on file  Substance and Sexual Activity  . Alcohol use: Not Currently  . Drug use: Not on file  .  Sexual activity: Not on file  Other Topics Concern  . Not on file  Social History Narrative  . Not on file   Social Determinants of Health   Financial Resource Strain: Not on file  Food Insecurity: Not on file  Transportation Needs: Not on file  Physical Activity: Not on file  Stress: Not on file  Social Connections: Not on file     Family History: The patient's family history includes Bladder Cancer in her mother; Breast cancer in her sister; Congestive Heart Failure in her father; Fibromyalgia in her sister; Hypertension in her mother and sister; Lung cancer in her sister; Prostate cancer in her father.  ROS:   Review of Systems  Constitution: Negative for decreased  appetite, fever and weight gain.  HENT: Negative for congestion, ear discharge, hoarse voice and sore throat.   Eyes: Negative for discharge, redness, vision loss in right eye and visual halos.  Cardiovascular: Reports chest pain dizziness session.  Leg swelling, orthopnea and palpitations.  Respiratory: Negative for cough, hemoptysis, shortness of breath and snoring.   Endocrine: Negative for heat intolerance and polyphagia.  Hematologic/Lymphatic: Negative for bleeding problem. Does not bruise/bleed easily.  Skin: Negative for flushing, nail changes, rash and suspicious lesions.  Musculoskeletal: Negative for arthritis, joint pain, muscle cramps, myalgias, neck pain and stiffness.  Gastrointestinal: Negative for abdominal pain, bowel incontinence, diarrhea and excessive appetite.  Genitourinary: Negative for decreased libido, genital sores and incomplete emptying.  Neurological: Negative for brief paralysis, focal weakness, headaches and loss of balance.  Psychiatric/Behavioral: Negative for altered mental status, depression and suicidal ideas.  Allergic/Immunologic: Negative for HIV exposure and persistent infections.    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   Recent Labs: No results found for requested labs within last 8760 hours.  Recent Lipid Panel No results found for: CHOL, TRIG, HDL, CHOLHDL, VLDL, LDLCALC, LDLDIRECT  Physical Exam:    VS:  BP 118/76 (BP Location: Right Arm)   Pulse 72   Ht 5\' 2"  (1.575 m)   Wt 163 lb 6.4 oz (74.1 kg)   SpO2 98%   BMI 29.89 kg/m     Wt Readings from Last 3 Encounters:  08/10/20 163 lb 6.4 oz (74.1 kg)  08/03/20 164 lb (74.4 kg)     GEN: Well nourished, well developed in no acute distress HEENT:  Normal NECK: No JVD; No carotid bruits LYMPHATICS: No lymphadenopathy CARDIAC: S1S2 noted,RRR, 2 out of 6 midsystolic murmurs, rubs, gallops RESPIRATORY:  Clear to auscultation without rales, wheezing or rhonchi  ABDOMEN: Soft, non-tender, non-distended, +bowel sounds, no guarding. EXTREMITIES: No edema, No cyanosis, no clubbing MUSCULOSKELETAL:  No deformity  SKIN: Warm and dry NEUROLOGIC:  Alert and oriented x 3, non-focal PSYCHIATRIC:  Normal affect, good insight  ASSESSMENT:    1. Precordial pain   2. Other chest pain   3. Murmur   4. Metabolic syndrome   5. Shortness of breath   6. Nonspecific abnormal electrocardiogram (ECG) (EKG)   7. Hyperlipidemia, unspecified hyperlipidemia type    PLAN:    The symptoms of chest pain with abnormal EKG is concerning, this patient does have intermediate risk for coronary artery disease and at this time I would like to pursue an ischemic evaluation in this patient.  Shared decision a coronary CTA at  this time is appropriate.  I have discussed with the patient about the testing.  The patient has no IV contrast allergy and is agreeable to proceed with this test.  Sublingual nitroglycerin prescription was sent, its protocol and 911 protocol explained and the patient vocalized understanding questions were answered to the patient's satisfaction  In terms of her shortness of breath in physical exam suggesting heart murmur and history of rheumatic fever I like to get an echocardiogram to make sure that valvular pathology is not playing a role here.   We will get blood work today for her lipid profile.  Continue her current statin medication.   The patient is in agreement with the above plan. The patient left the office in stable condition.  The patient will follow up in   Medication Adjustments/Labs and Tests Ordered: Current medicines are reviewed at length with the patient today.  Concerns regarding medicines are outlined above.  Orders  Placed This Encounter  Procedures  . CT CORONARY MORPH W/CTA COR W/SCORE W/CA W/CM &/OR WO/CM  . Lipid panel  . Basic metabolic panel  . Magnesium  . EKG 12-Lead  . ECHOCARDIOGRAM COMPLETE   Meds ordered this encounter  Medications  . nitroGLYCERIN (NITROSTAT) 0.4 MG SL tablet    Sig: Place 1 tablet (0.4 mg total) under the tongue every 5 (five) minutes as needed for chest pain. Up to three times.    Dispense:  45 tablet    Refill:  1  . metoprolol tartrate (LOPRESSOR) 100 MG tablet    Sig: Take 2 hours prior to CT    Dispense:  1 tablet    Refill:  0    Patient Instructions   Medication Instructions:  Your physician has recommended you make the following change in your medication:  START: Nitroglycerin 0.4 mg take one tablet by mouth every 5 minutes up to three times as needed for chest pain.  *If you need a refill on your cardiac medications before your next appointment, please call your pharmacy*   Lab Work: Your physician recommends that you return for lab work: TODAY: Lipids 3-7 days before CT: BMET, Mag If you have labs (blood work) drawn today and your tests are completely normal, you will receive your results only by: [place] MyChart Message (if you have MyChart) OR . A paper copy in the mail If you have any lab test that is abnormal or we need to change your treatment, we will call you to review the results.   Testing/Procedures: Your physician has requested that you have an echocardiogram. Echocardiography is a painless test that uses sound waves to create images of your heart. It provides your doctor with information about the size and shape of your heart and how well your heart's chambers and valves are working. This procedure takes approximately one hour. There are no restrictions for this procedure.  Your cardiac CT will be scheduled at:  [facility] [address] [place], [place] [address] [phone]  If scheduled at [facility],  please arrive at the [facility] main entrance (entrance A) of [facility] 30 minutes prior to test start time. Proceed to the [facility] Radiology Department (first floor) to check-in and test prep.   Please follow these instructions carefully (unless otherwise directed):  On the Night Before the Test: . Be sure to Drink plenty of water. . Do not consume any caffeinated/decaffeinated beverages or chocolate 12 hours prior to your test. . Do not take  any antihistamines 12 hours prior to your test.  On the Day of the Test: . Drink plenty of water until 1 hour prior to the test. . Do not eat any food 4 hours prior to the test. . You may take your regular medications prior to the test.  . Take metoprolol (Lopressor) two hours prior to test. . HOLD Hydrochlorothiazide (Ziac) morning of the test. . FEMALES- please wear underwire-free bra if available      After the Test: . Drink plenty of water. . After receiving IV contrast, you may experience a mild flushed feeling. This is normal. . On occasion, you may experience a mild rash up to 24 hours after the test. This is not dangerous. If this occurs, you can take Benadryl 25 mg and increase your fluid intake. . If you experience trouble breathing, this can be serious. If it is severe call 911 IMMEDIATELY. If it is mild, please call our office. . If you take any of these medications: Glipizide/Metformin, Avandament, Glucavance, please do not take 48 hours after completing test unless otherwise instructed.   Once we have confirmed authorization from your insurance company, we will call you to set up a date and time for your test. Based on how quickly your insurance processes prior authorizations requests, please allow up to 4 weeks to be contacted for scheduling your Cardiac CT appointment. Be advised that routine Cardiac CT appointments could be scheduled as many as 8 weeks after your provider has ordered it.  For non-scheduling related  questions, please contact the cardiac imaging nurse navigator should you have any questions/concerns: Marchia Bond, Cardiac Imaging Nurse Navigator Gordy Clement, Cardiac Imaging Nurse Navigator [facility] Direct Office Dial: [phone]   For scheduling needs, including cancellations and rescheduling, please call [place], [phone].     Follow-Up: At [facility] [place], you and your health needs are our priority.  As part of our continuing mission to provide you with exceptional heart care, we have created designated Provider Care Teams.  These Care Teams include your primary Cardiologist (physician) and Advanced Practice Providers (APPs -  Physician Assistants and Nurse Practitioners) who all work together to provide you with the care you need, when you need it.  We recommend signing up for the patient portal called "MyChart".  Sign up information is provided on this After Visit Summary.  MyChart is used to connect with patients for Virtual Visits (Telemedicine).  Patients are able to view lab/test results, encounter notes, upcoming appointments, etc.  Non-urgent messages can be sent to your provider as well.   To learn more about what you can do with MyChart, go to [email or web address].    Your next appointment:   3 month(s)  The format for your next appointment:   In Person  Provider:   Berniece Salines, DO   Other Instructions  Echocardiogram An echocardiogram is a test that uses sound waves (ultrasound) to produce images of the heart. Images from an echocardiogram can provide important information about:  Heart size and shape.  The size and thickness and movement of your heart's walls.  Heart muscle function and strength.  Heart valve function or if you have stenosis. Stenosis is when the heart valves are too narrow.  If blood is flowing backward through the heart valves (regurgitation).  A tumor or infectious growth around the heart  valves.  Areas of heart muscle that are not working well because of poor blood flow or injury from a heart attack.  Aneurysm detection. An aneurysm is a weak or damaged part of an artery wall. The wall bulges out from the normal force of blood pumping through the body. Tell a health care provider about:  Any allergies you have.  All medicines you are taking, including vitamins, herbs, eye drops, creams, and over-the-counter medicines.  Any blood disorders you have.  Any surgeries you have had.  Any medical conditions you have.  Whether you are pregnant or may be pregnant. What are the risks? Generally, this is a safe test. However, problems may occur, including an allergic reaction to dye (contrast) that may be used during the test. What happens before the test? No specific preparation is needed. You may eat and drink normally. What happens during the test?  You will take off your clothes from the waist up and put on a hospital gown.  Electrodes or electrocardiogram (ECG)patches may be placed on your chest. The electrodes or patches are then connected to a device that monitors your heart rate and rhythm.  You will lie down on a table for an ultrasound exam. A gel will be applied to your chest to help sound waves pass through your skin.  A handheld device, called a transducer, will be pressed against your chest and moved over your heart. The transducer produces sound waves that travel to your heart and bounce back (or "echo" back) to the transducer. These sound waves will be captured in real-time and changed into images of your heart that can be viewed on a video monitor. The images will be recorded on a computer and reviewed by your health care provider.  You may be asked to change positions or hold your breath for a short time. This makes it easier to get different views or better views of your heart.  In some cases, you may receive contrast through an IV in one of your veins. This  can improve the quality of the pictures from your heart. The procedure may vary among health care providers and hospitals.   What can I expect after the test? You may return to your normal, everyday life, including diet, activities, and medicines, unless your health care provider tells you not to do that. Follow these instructions at home:  It is up to you to get the results of your test. Ask your health care provider, or the department that is doing the test, when your results will be ready.  Keep all follow-up visits. This is important. Summary  An echocardiogram is a test that uses sound waves (ultrasound) to produce images of the heart.  Images from an echocardiogram can provide important information about the size and shape of your heart, heart muscle function, heart valve function, and other possible heart problems.  You do not need to do anything to prepare before this test. You may eat and drink normally.  After the echocardiogram is completed, you may return to your normal, everyday life, unless your health care provider tells you not to do that. This information is not intended to replace advice given to you by your health care provider. Make sure you discuss any questions you have with your health care provider. Document Revised: 12/14/2019 Document Reviewed: 12/14/2019 Elsevier Patient Education  2021 [place].      Adopting a Healthy Lifestyle.  Know what a healthy weight is for you (roughly BMI <25) and aim to maintain this   Aim for 7+ servings of fruits and vegetables daily   65-80+ fluid ounces of water  or unsweet tea for healthy kidneys   Limit to max 1 drink of alcohol per day; avoid smoking/tobacco   Limit animal fats in diet for cholesterol and heart health - choose grass fed whenever available   Avoid highly processed foods, and foods high in saturated/trans fats   Aim for low stress - take time to unwind and care for your mental health   Aim for  150 min of moderate intensity exercise weekly for heart health, and weights twice weekly for bone health   Aim for 7-9 hours of sleep daily   When it comes to diets, agreement about the perfect plan isnt easy to find, even among the experts. Experts at the [employer] developed an idea known as the Healthy Eating Plate. Just imagine a plate divided into logical, healthy portions.   The emphasis is on diet quality:   Load up on vegetables and fruits - one-half of your plate: Aim for color and variety, and remember that potatoes dont count.   Go for whole grains - one-quarter of your plate: Whole wheat, barley, wheat berries, quinoa, oats, brown rice, and foods made with them. If you want pasta, go with whole wheat pasta.   Protein power - one-quarter of your plate: Fish, chicken, beans, and nuts are all healthy, versatile protein sources. Limit red meat.   The diet, however, does go beyond the plate, offering a few other suggestions.   Use healthy plant oils, such as olive, canola, soy, corn, sunflower and peanut. Check the labels, and avoid partially hydrogenated oil, which have unhealthy trans fats.   If youre thirsty, drink water. Coffee and tea are good in moderation, but skip sugary drinks and limit milk and dairy products to one or two daily servings.   The type of carbohydrate in the diet is more important than the amount. Some sources of carbohydrates, such as vegetables, fruits, whole grains, and beans-are healthier than others.   Finally, stay active  Signed, Berniece Salines, DO  08/10/2020 9:50 AM    [place]

## 2020-08-15 ENCOUNTER — Telehealth: Payer: Self-pay | Admitting: Cardiology

## 2020-08-15 ENCOUNTER — Other Ambulatory Visit: Payer: Self-pay

## 2020-08-15 MED ORDER — EZETIMIBE 10 MG PO TABS: 10.0000 mg | ORAL_TABLET | Freq: Every day | ORAL | 3 refills | Status: AC

## 2020-08-15 NOTE — Telephone Encounter (Signed)
Patient calling back.   °

## 2020-08-15 NOTE — Telephone Encounter (Signed)
Patient was returning phone call 

## 2020-08-17 ENCOUNTER — Ambulatory Visit: Payer: Medicare Other | Admitting: Cardiology

## 2020-08-18 NOTE — Telephone Encounter (Signed)
Spoke with patient, see chart.    

## 2020-08-25 ENCOUNTER — Telehealth: Payer: Self-pay

## 2020-08-25 NOTE — Telephone Encounter (Signed)
Called patient to remind her to get lab work drawn before her appointment Wednesday. She verbalized understanding and thanked me for calling and reminding her.

## 2020-08-28 DIAGNOSIS — R011 Cardiac murmur, unspecified: Secondary | ICD-10-CM | POA: Diagnosis not present

## 2020-08-28 DIAGNOSIS — R9431 Abnormal electrocardiogram [ECG] [EKG]: Secondary | ICD-10-CM | POA: Diagnosis not present

## 2020-08-28 DIAGNOSIS — R0602 Shortness of breath: Secondary | ICD-10-CM | POA: Diagnosis not present

## 2020-08-28 DIAGNOSIS — E8881 Metabolic syndrome: Secondary | ICD-10-CM | POA: Diagnosis not present

## 2020-08-29 ENCOUNTER — Telehealth: Payer: Self-pay | Admitting: Cardiology

## 2020-08-29 ENCOUNTER — Telehealth (HOSPITAL_COMMUNITY): Payer: Self-pay | Admitting: Emergency Medicine

## 2020-08-29 LAB — BASIC METABOLIC PANEL
BUN/Creatinine Ratio: 13 (ref 12–28)
BUN: 11 mg/dL (ref 8–27)
CO2: 25 mmol/L (ref 20–29)
Calcium: 8.8 mg/dL (ref 8.7–10.3)
Chloride: 106 mmol/L (ref 96–106)
Creatinine, Ser: 0.82 mg/dL (ref 0.57–1.00)
Glucose: 103 mg/dL — ABNORMAL HIGH (ref 65–99)
Potassium: 4.1 mmol/L (ref 3.5–5.2)
Sodium: 145 mmol/L — ABNORMAL HIGH (ref 134–144)
eGFR: 75 mL/min/{1.73_m2} (ref >59)

## 2020-08-29 LAB — MAGNESIUM: Magnesium: 2.1 mg/dL (ref 1.6–2.3)

## 2020-08-29 NOTE — Telephone Encounter (Signed)
Patient is calling in with question about there labs results. She has concern that she would like to be address before she has her CT done on tomorrow. Please advdise

## 2020-08-29 NOTE — Telephone Encounter (Signed)
Advised pt that her labs are good for her CT scan tomorrow. Pt verbalized understanding and she had no questions.

## 2020-08-29 NOTE — Telephone Encounter (Signed)
Reaching out to patient to offer assistance regarding upcoming cardiac imaging study; pt verbalizes understanding of appt date/time, parking situation and where to check in, pre-test NPO status and medications ordered, and verified current allergies; name and call back number provided for further questions should they arise Marchia Bond RN Navigator Cardiac Imaging [facility] [phone] office [phone] cell  100mg  metoprolol tart 2 hr prior to scan Clarise Cruz

## 2020-08-30 ENCOUNTER — Encounter (HOSPITAL_COMMUNITY): Payer: Self-pay

## 2020-08-30 ENCOUNTER — Other Ambulatory Visit: Payer: Self-pay

## 2020-08-30 ENCOUNTER — Ambulatory Visit (HOSPITAL_COMMUNITY)
Admission: RE | Admit: 2020-08-30 | Discharge: 2020-08-30 | Disposition: A | Discharge status: Home / Self Care | Payer: Medicare Other | Source: Ambulatory Visit | Attending: Cardiology | Admitting: Cardiology

## 2020-08-30 DIAGNOSIS — R072 Precordial pain: Secondary | ICD-10-CM | POA: Insufficient documentation

## 2020-08-30 MED ORDER — NITROGLYCERIN 0.4 MG SL SUBL
SUBLINGUAL_TABLET | SUBLINGUAL | Status: AC
  Filled 2020-08-30: qty 2

## 2020-08-30 MED ORDER — IOHEXOL 350 MG/ML SOLN
95.0000 mL | Freq: Once | INTRAVENOUS | Status: AC | PRN
  Administered 2020-08-30: 95 mL via INTRAVENOUS

## 2020-08-30 MED ORDER — NITROGLYCERIN 0.4 MG SL SUBL
0.8000 mg | SUBLINGUAL_TABLET | Freq: Once | SUBLINGUAL | Status: AC
  Administered 2020-08-30: 0.8 mg via SUBLINGUAL

## 2020-09-06 ENCOUNTER — Ambulatory Visit (INDEPENDENT_AMBULATORY_CARE_PROVIDER_SITE_OTHER): Payer: Medicare Other

## 2020-09-06 ENCOUNTER — Other Ambulatory Visit: Payer: Self-pay

## 2020-09-06 DIAGNOSIS — R011 Cardiac murmur, unspecified: Secondary | ICD-10-CM

## 2020-09-06 LAB — ECHOCARDIOGRAM COMPLETE
Area-P 1/2: 3.45 cm2
S' Lateral: 2.4 cm

## 2020-09-11 ENCOUNTER — Other Ambulatory Visit: Payer: Self-pay

## 2020-09-11 DIAGNOSIS — I77810 Thoracic aortic ectasia: Secondary | ICD-10-CM

## 2020-09-11 NOTE — Progress Notes (Signed)
Spoke with patient about her CT scan and echo results, and having an abdominal ultrasound and let her know I would send her results to her PCP. Patient verbalizes understanding. No further questions or concerns at this time.

## 2020-09-13 ENCOUNTER — Telehealth: Payer: Self-pay

## 2020-09-13 ENCOUNTER — Encounter: Payer: Self-pay | Admitting: Cardiology

## 2020-09-13 DIAGNOSIS — I714 Abdominal aortic aneurysm, without rupture, unspecified: Secondary | ICD-10-CM

## 2020-09-13 NOTE — Telephone Encounter (Signed)
Results reviewed with pt as per Dr. Tobb's note.  Pt verbalized understanding and had no additional questions. Routed to PCP 

## 2020-09-21 DIAGNOSIS — R079 Chest pain, unspecified: Secondary | ICD-10-CM | POA: Diagnosis not present

## 2020-10-05 ENCOUNTER — Other Ambulatory Visit: Payer: Self-pay

## 2020-10-05 ENCOUNTER — Ambulatory Visit (INDEPENDENT_AMBULATORY_CARE_PROVIDER_SITE_OTHER): Payer: Medicare Other

## 2020-10-05 DIAGNOSIS — I714 Abdominal aortic aneurysm, without rupture, unspecified: Secondary | ICD-10-CM

## 2020-10-05 NOTE — Progress Notes (Signed)
Abdominal aortic duplex exam performed.  Jimmy [place] Dube RDCS, RVT 

## 2020-10-06 ENCOUNTER — Telehealth: Payer: Self-pay

## 2020-10-06 NOTE — Telephone Encounter (Signed)
Left message for patient to return our call.

## 2020-10-11 ENCOUNTER — Telehealth: Payer: Self-pay | Admitting: Cardiology

## 2020-10-11 NOTE — Telephone Encounter (Signed)
Patient was calling to discuss results from AAA test. She does not understand some of the medical terminology

## 2020-10-11 NOTE — Telephone Encounter (Signed)
Called patient informed her of results.  

## 2020-10-18 DIAGNOSIS — I10 Essential (primary) hypertension: Secondary | ICD-10-CM | POA: Diagnosis not present

## 2020-10-18 DIAGNOSIS — E663 Overweight: Secondary | ICD-10-CM | POA: Diagnosis not present

## 2020-10-18 DIAGNOSIS — Z6829 Body mass index (BMI) 29.0-29.9, adult: Secondary | ICD-10-CM | POA: Diagnosis not present

## 2020-10-18 DIAGNOSIS — K219 Gastro-esophageal reflux disease without esophagitis: Secondary | ICD-10-CM | POA: Diagnosis not present

## 2020-10-18 DIAGNOSIS — E559 Vitamin D deficiency, unspecified: Secondary | ICD-10-CM | POA: Diagnosis not present

## 2020-10-18 DIAGNOSIS — F3341 Major depressive disorder, recurrent, in partial remission: Secondary | ICD-10-CM | POA: Diagnosis not present

## 2020-10-20 ENCOUNTER — Encounter: Payer: Self-pay | Admitting: Cardiology

## 2020-10-20 ENCOUNTER — Ambulatory Visit (INDEPENDENT_AMBULATORY_CARE_PROVIDER_SITE_OTHER): Payer: Medicare Other | Admitting: Cardiology

## 2020-10-20 ENCOUNTER — Other Ambulatory Visit: Payer: Self-pay

## 2020-10-20 VITALS — BP 120/76 | HR 74 | Ht 62.0 in | Wt 161.0 lb

## 2020-10-20 DIAGNOSIS — I10 Essential (primary) hypertension: Secondary | ICD-10-CM | POA: Diagnosis not present

## 2020-10-20 DIAGNOSIS — E782 Mixed hyperlipidemia: Secondary | ICD-10-CM | POA: Diagnosis not present

## 2020-10-20 DIAGNOSIS — R0789 Other chest pain: Secondary | ICD-10-CM | POA: Diagnosis not present

## 2020-10-20 NOTE — Patient Instructions (Signed)
Medication Instructions:  Your physician recommends that you continue on your current medications as directed. Please refer to the Current Medication list given to you today.  *If you need a refill on your cardiac medications before your next appointment, please call your pharmacy*   Lab Work: None ordered   If you have labs (blood work) drawn today and your tests are completely normal, you will receive your results only by: [place] (if you have MyChart) OR A paper copy in the mail If you have any lab test that is abnormal or we need to change your treatment, we will call you to review the results.   Testing/Procedures: None ordered    Follow-Up: At [facility], you and your health needs are our priority.  As part of our continuing mission to provide you with exceptional heart care, we have created designated Provider Care Teams.  These Care Teams include your primary Cardiologist (physician) and Advanced Practice Providers (APPs -  Physician Assistants and Nurse Practitioners) who all work together to provide you with the care you need, when you need it.  We recommend signing up for the patient portal called "MyChart".  Sign up information is provided on this After Visit Summary.  MyChart is used to connect with patients for Virtual Visits (Telemedicine).  Patients are able to view lab/test results, encounter notes, upcoming appointments, etc.  Non-urgent messages can be sent to your provider as well.   To learn more about what you can do with MyChart, go to [email or web address].    Your next appointment:   6 month(s)  The format for your next appointment:   In Person  Provider:   You will see Dr Jenne Campus.  Or, you can be scheduled with the following Advanced Practice Provider on your designated Care Team (at our [facility]):    Other Instructions None

## 2020-10-20 NOTE — Progress Notes (Signed)
Cardiology Office Note:    Date:  10/20/2020   ID:  Amy Butler, DOB 1945/10/05, MRN [id number]  PCP:  Greig Right, MD  Cardiologist:  Jenne Campus, MD    Referring MD: Greig Right, MD   Chief Complaint  Patient presents with   Follow-up  I am doing very well  History of Present Illness:    Amy Butler is a 75 y.o. female with past medical history significant for essential hypertension, dyslipidemia, irritable bowel syndrome, depression.  Recently she was seen by my partner Dr. Harriet Butler.  She was evaluated because of episodes of atypical chest pain.  That evaluation included coronary CT angio which was normal.  She also had echocardiogram which revealed minimal pericardial effusion without evidence of tamponade.  She is here in my office to talk about those findings.  Overall she is doing very well.  She denies have any chest pain, tightness, pressure, burning in her chest, no palpitations no dizziness.  She is [place]  Past Medical History:  Diagnosis Date   Dermatitis    Diverticulosis    Effects of hunger 10/14/2013   Esophageal reflux 11/02/2015   GERD (gastroesophageal reflux disease)    History of laparoscopic adjustable gastric banding 11/29/2005   Hyperlipidemia    Hypertension    IBS (irritable bowel syndrome)    Impaired mobility and ADLs    Major depression    Morbid obesity ([place]) 10/14/2013   Obsessive compulsive disorder     Past Surgical History:  Procedure Laterality Date   LAMINECTOMY     cervical region   LAPAROSCOPIC GASTRIC BANDING  2007   REFRACTIVE SURGERY     THYROIDECTOMY, PARTIAL     TUBAL LIGATION      Current Medications: Current Meds  Medication Sig   bisoprolol-hydrochlorothiazide (ZIAC) 2.5-6.25 MG tablet Take 0.5 tablets by mouth daily. Take 1/2 tablet daily   calcium citrate-vitamin D (CITRACAL+D) 315-200 MG-UNIT tablet Take 1 tablet by mouth daily.   Cholecalciferol (VITAMIN D3) 25  MCG (1000 UT) CAPS Take 1,000 Units by mouth daily.   clonazePAM (KLONOPIN) 0.5 MG tablet Take 0.5 mg by mouth 2 (two) times daily as needed for anxiety.   ezetimibe (ZETIA) 10 MG tablet Take 1 tablet (10 mg total) by mouth daily.   famotidine (PEPCID) 40 MG tablet Take 40 mg by mouth daily.   nitroGLYCERIN (NITROSTAT) 0.4 MG SL tablet Place 1 tablet (0.4 mg total) under the tongue every 5 (five) minutes as needed for chest pain. Up to three times.   pravastatin (PRAVACHOL) 80 MG tablet Take 80 mg by mouth daily.   QUEtiapine (SEROQUEL) 100 MG tablet Take 200 mg by mouth at bedtime.   sertraline (ZOLOFT) 100 MG tablet Take 100 mg by mouth daily.   vitamin B-12 (CYANOCOBALAMIN) 1000 MCG tablet Take 1,000 mcg by mouth daily.   vitamin C (ASCORBIC ACID) 500 MG tablet Take 500 mg by mouth daily.     Allergies:   Crestor [rosuvastatin], Penicillins, and Gabapentin   Social History   Socioeconomic History   Marital status: Married    Spouse name: Not on file   Number of children: Not on file   Years of education: Not on file   Highest education level: Not on file  Occupational History   Not on file  Tobacco Use   Smoking status: Never   Smokeless tobacco: Never  Substance and Sexual Activity   Alcohol use: Never  Drug use: Never   Sexual activity: Not on file  Other Topics Concern   Not on file  Social History Narrative   Not on file   Social Determinants of Health   Financial Resource Strain: Not on file  Food Insecurity: Not on file  Transportation Needs: Not on file  Physical Activity: Not on file  Stress: Not on file  Social Connections: Not on file     Family History: The patient's family history includes Bladder Cancer in her mother; Breast cancer in her sister; Congestive Heart Failure in her father; Fibromyalgia in her sister; Hypertension in her mother and sister; Lung cancer in her sister; Prostate cancer in her father. ROS:   Please see the history of present  illness.    All 14 point review of systems negative except as described per history of present illness  EKGs/Labs/Other Studies Reviewed:      Recent Labs: 08/28/2020: BUN 11; Creatinine, Ser 0.82; Magnesium 2.1; Potassium 4.1; Sodium 145  Recent Lipid Panel    Component Value Date/Time   CHOL 218 (H) 08/10/2020 0927   TRIG 178 (H) 08/10/2020 0927   HDL 50 08/10/2020 0927   CHOLHDL 4.4 08/10/2020 0927   LDLCALC 136 (H) 08/10/2020 0927    Physical Exam:    VS:  BP 120/76 (BP Location: Right Arm, Patient Position: Sitting, Cuff Size: Normal)   Pulse 74   Ht 5\' 2"  (1.575 m)   Wt 161 lb (73 kg)   SpO2 94%   BMI 29.45 kg/m     Wt Readings from Last 3 Encounters:  10/20/20 161 lb (73 kg)  08/10/20 163 lb 6.4 oz (74.1 kg)  08/03/20 164 lb (74.4 kg)     GEN:  Well nourished, well developed in no acute distress HEENT: Normal NECK: No JVD; No carotid bruits LYMPHATICS: No lymphadenopathy CARDIAC: RRR, no murmurs, no rubs, no gallops RESPIRATORY:  Clear to auscultation without rales, wheezing or rhonchi  ABDOMEN: Soft, non-tender, non-distended MUSCULOSKELETAL:  No edema; No deformity  SKIN: Warm and dry LOWER EXTREMITIES: no swelling NEUROLOGIC:  Alert and oriented x 3 PSYCHIATRIC:  Normal affect   ASSESSMENT:    1. Mixed hyperlipidemia   2. Atypical chest pain   3. Primary hypertension    PLAN:    In order of problems listed above:  Dyslipidemia I did review her last K PN which show me LDL of 136 HDL 50 she is taking Zetia and pravastatin however that medication had been started after this cholesterol profile.  She is scheduled to have another fasting lipid profile done. 2.  Atypical chest pain: We did discuss this with great details.  She did have coronary CT angio which was normal with no significant calcifications.  She is overall very low risk for decreased risk factors modification which we will continue.  She denies having any chest pain now 3.  Small  pericardial fusion which is a new problem no symptoms no JVD I am warned her about potential signs and symptoms of worsening of this issue meaning shortness of breath or swelling of lower extremities.  She will let me know if it happens.  Overall told her that she is doing very well.  We will continue present management and I will see her back in the office in 6 months Medication Adjustments/Labs and Tests Ordered: Current medicines are reviewed at length with the patient today.  Concerns regarding medicines are outlined above.  No orders of the defined types were placed in  this encounter.  Medication changes: No orders of the defined types were placed in this encounter.   Signed, Park Liter, MD, [facility] 10/20/2020 1:35 PM    [facility]

## 2020-10-31 ENCOUNTER — Ambulatory Visit: Payer: Medicare Other | Admitting: Cardiology

## 2020-11-09 DIAGNOSIS — R229 Localized swelling, mass and lump, unspecified: Secondary | ICD-10-CM | POA: Diagnosis not present

## 2020-11-13 DIAGNOSIS — Z20822 Contact with and (suspected) exposure to covid-19: Secondary | ICD-10-CM | POA: Diagnosis not present

## 2020-11-29 DIAGNOSIS — Z1231 Encounter for screening mammogram for malignant neoplasm of breast: Secondary | ICD-10-CM | POA: Diagnosis not present

## 2021-01-03 DIAGNOSIS — R922 Inconclusive mammogram: Secondary | ICD-10-CM | POA: Diagnosis not present

## 2021-01-03 DIAGNOSIS — R928 Other abnormal and inconclusive findings on diagnostic imaging of breast: Secondary | ICD-10-CM | POA: Diagnosis not present

## 2021-01-24 DIAGNOSIS — L304 Erythema intertrigo: Secondary | ICD-10-CM | POA: Diagnosis not present

## 2021-01-24 DIAGNOSIS — L720 Epidermal cyst: Secondary | ICD-10-CM | POA: Diagnosis not present

## 2021-02-02 DIAGNOSIS — F329 Major depressive disorder, single episode, unspecified: Secondary | ICD-10-CM | POA: Diagnosis not present

## 2021-02-02 DIAGNOSIS — E7800 Pure hypercholesterolemia, unspecified: Secondary | ICD-10-CM | POA: Diagnosis not present

## 2021-02-02 DIAGNOSIS — I10 Essential (primary) hypertension: Secondary | ICD-10-CM | POA: Diagnosis not present

## 2021-02-14 DIAGNOSIS — Z20822 Contact with and (suspected) exposure to covid-19: Secondary | ICD-10-CM | POA: Diagnosis not present

## 2021-03-20 DIAGNOSIS — L814 Other melanin hyperpigmentation: Secondary | ICD-10-CM | POA: Diagnosis not present

## 2021-03-20 DIAGNOSIS — D2239 Melanocytic nevi of other parts of face: Secondary | ICD-10-CM | POA: Diagnosis not present

## 2021-03-20 DIAGNOSIS — L821 Other seborrheic keratosis: Secondary | ICD-10-CM | POA: Diagnosis not present

## 2021-03-20 DIAGNOSIS — L820 Inflamed seborrheic keratosis: Secondary | ICD-10-CM | POA: Diagnosis not present

## 2021-03-20 DIAGNOSIS — D225 Melanocytic nevi of trunk: Secondary | ICD-10-CM | POA: Diagnosis not present

## 2021-03-21 DIAGNOSIS — Z23 Encounter for immunization: Secondary | ICD-10-CM | POA: Diagnosis not present

## 2021-03-27 DIAGNOSIS — N390 Urinary tract infection, site not specified: Secondary | ICD-10-CM | POA: Diagnosis not present

## 2021-03-27 DIAGNOSIS — M549 Dorsalgia, unspecified: Secondary | ICD-10-CM | POA: Diagnosis not present

## 2021-03-27 DIAGNOSIS — I10 Essential (primary) hypertension: Secondary | ICD-10-CM | POA: Diagnosis not present

## 2021-04-02 DIAGNOSIS — R310 Gross hematuria: Secondary | ICD-10-CM | POA: Diagnosis not present

## 2021-04-02 DIAGNOSIS — Z23 Encounter for immunization: Secondary | ICD-10-CM | POA: Diagnosis not present

## 2021-04-15 DIAGNOSIS — Z880 Allergy status to penicillin: Secondary | ICD-10-CM | POA: Diagnosis not present

## 2021-04-15 DIAGNOSIS — I10 Essential (primary) hypertension: Secondary | ICD-10-CM | POA: Diagnosis not present

## 2021-04-15 DIAGNOSIS — K2950 Unspecified chronic gastritis without bleeding: Secondary | ICD-10-CM | POA: Diagnosis not present

## 2021-04-15 DIAGNOSIS — E119 Type 2 diabetes mellitus without complications: Secondary | ICD-10-CM | POA: Diagnosis not present

## 2021-04-15 DIAGNOSIS — R109 Unspecified abdominal pain: Secondary | ICD-10-CM | POA: Diagnosis not present

## 2021-04-15 DIAGNOSIS — R1031 Right lower quadrant pain: Secondary | ICD-10-CM | POA: Diagnosis not present

## 2021-04-15 DIAGNOSIS — Z79899 Other long term (current) drug therapy: Secondary | ICD-10-CM | POA: Diagnosis not present

## 2021-04-15 DIAGNOSIS — K859 Acute pancreatitis without necrosis or infection, unspecified: Secondary | ICD-10-CM | POA: Diagnosis not present

## 2021-05-01 DIAGNOSIS — N3000 Acute cystitis without hematuria: Secondary | ICD-10-CM | POA: Diagnosis not present

## 2021-05-02 DIAGNOSIS — Z79899 Other long term (current) drug therapy: Secondary | ICD-10-CM | POA: Diagnosis not present

## 2021-05-02 DIAGNOSIS — M859 Disorder of bone density and structure, unspecified: Secondary | ICD-10-CM | POA: Diagnosis not present

## 2021-05-02 DIAGNOSIS — F3341 Major depressive disorder, recurrent, in partial remission: Secondary | ICD-10-CM | POA: Diagnosis not present

## 2021-05-02 DIAGNOSIS — Z0000 Encounter for general adult medical examination without abnormal findings: Secondary | ICD-10-CM | POA: Diagnosis not present

## 2021-05-02 DIAGNOSIS — I10 Essential (primary) hypertension: Secondary | ICD-10-CM | POA: Diagnosis not present

## 2021-05-02 DIAGNOSIS — E7800 Pure hypercholesterolemia, unspecified: Secondary | ICD-10-CM | POA: Diagnosis not present

## 2021-05-02 DIAGNOSIS — Z6828 Body mass index (BMI) 28.0-28.9, adult: Secondary | ICD-10-CM | POA: Diagnosis not present

## 2021-05-02 DIAGNOSIS — K219 Gastro-esophageal reflux disease without esophagitis: Secondary | ICD-10-CM | POA: Diagnosis not present

## 2021-05-02 DIAGNOSIS — K859 Acute pancreatitis without necrosis or infection, unspecified: Secondary | ICD-10-CM | POA: Diagnosis not present

## 2021-05-04 DIAGNOSIS — F329 Major depressive disorder, single episode, unspecified: Secondary | ICD-10-CM | POA: Diagnosis not present

## 2021-05-04 DIAGNOSIS — E7800 Pure hypercholesterolemia, unspecified: Secondary | ICD-10-CM | POA: Diagnosis not present

## 2021-05-04 DIAGNOSIS — I10 Essential (primary) hypertension: Secondary | ICD-10-CM | POA: Diagnosis not present

## 2021-05-21 DIAGNOSIS — F419 Anxiety disorder, unspecified: Secondary | ICD-10-CM | POA: Insufficient documentation

## 2021-05-22 ENCOUNTER — Encounter: Payer: Self-pay | Admitting: Cardiology

## 2021-05-22 ENCOUNTER — Other Ambulatory Visit: Payer: Self-pay

## 2021-05-22 ENCOUNTER — Ambulatory Visit: Payer: Medicare Other | Admitting: Cardiology

## 2021-05-22 VITALS — BP 118/80 | HR 70 | Ht 62.0 in | Wt 146.0 lb

## 2021-05-22 DIAGNOSIS — I10 Essential (primary) hypertension: Secondary | ICD-10-CM | POA: Diagnosis not present

## 2021-05-22 DIAGNOSIS — I3139 Other pericardial effusion (noninflammatory): Secondary | ICD-10-CM | POA: Diagnosis not present

## 2021-05-22 DIAGNOSIS — K588 Other irritable bowel syndrome: Secondary | ICD-10-CM | POA: Diagnosis not present

## 2021-05-22 DIAGNOSIS — E782 Mixed hyperlipidemia: Secondary | ICD-10-CM

## 2021-05-22 LAB — LIPID PANEL
Chol/HDL Ratio: 3.5 ratio (ref 0.0–4.4)
Cholesterol, Total: 154 mg/dL (ref 100–199)
HDL: 44 mg/dL (ref >39)
LDL Chol Calc (NIH): 91 mg/dL (ref 0–99)
Triglycerides: 100 mg/dL (ref 0–149)
VLDL Cholesterol Cal: 19 mg/dL (ref 5–40)

## 2021-05-22 LAB — ALT: ALT: 104 IU/L — ABNORMAL HIGH (ref 0–32)

## 2021-05-22 LAB — AST: AST: 139 IU/L — ABNORMAL HIGH (ref 0–40)

## 2021-05-22 NOTE — Progress Notes (Signed)
Cardiology Office Note:    Date:  05/22/2021   ID:  Amy Butler, DOB [birth year], MRN [id number]  PCP:  Greig Right, MD  Cardiologist:  Jenne Campus, MD    Referring MD: Greig Right, MD   Chief Complaint  Patient presents with   Follow-up  I had a blood in my urine  History of Present Illness:    Amy Butler is a 76 y.o. female with past medical history significant for essential hypertension, dyslipidemia, irritable bowel syndrome, depression.  She did have work-up for CAD done last year which included coronary CT angio which was normal echocardiogram has been performed showed preserved left ventricle ejection fraction but there was small pericardial effusion no evidence of tamponade.  She comes today to my office for follow-up.  She complain of having some hematuria she ended going to her physician antibiotic was be given.  She also complained of lack Of appetite and she lost significant amount of weight to hold those signs and symptoms are worrisome.  She wants to be established in the urology service in [place] however [place] available appointment they have is at the end of March.  I advised her to go and see Dr. Nila Nephew here in town.  She denies have any cardiac complications.  There is no chest pain tightness squeezing pressure burning chest he does have some fatigue and shortness of breath  Past Medical History:  Diagnosis Date   Dermatitis    Diverticulosis    Effects of hunger 10/14/2013   Esophageal reflux 11/02/2015   GERD (gastroesophageal reflux disease)    History of laparoscopic adjustable gastric banding 11/29/2005   Hyperlipidemia    Hypertension    IBS (irritable bowel syndrome)    Impaired mobility and ADLs    Major depression    Morbid obesity ([place]) 10/14/2013   Obsessive compulsive disorder     Past Surgical History:  Procedure Laterality Date   LAMINECTOMY     cervical region   LAPAROSCOPIC GASTRIC BANDING  2007   REFRACTIVE SURGERY      THYROIDECTOMY, PARTIAL     TUBAL LIGATION      Current Medications: Current Meds  Medication Sig   bisoprolol-hydrochlorothiazide (ZIAC) 2.5-6.25 MG tablet Take 0.5 tablets by mouth daily. Take 1/2 tablet daily   calcium citrate-vitamin D (CITRACAL+D) 315-200 MG-UNIT tablet Take 1 tablet by mouth daily.   Cholecalciferol (VITAMIN D3) 25 MCG (1000 UT) CAPS Take 1,000 Units by mouth daily.   clonazePAM (KLONOPIN) 0.5 MG tablet Take 0.5 mg by mouth 2 (two) times daily as needed for anxiety.   ezetimibe (ZETIA) 10 MG tablet Take 1 tablet (10 mg total) by mouth daily.   famotidine (PEPCID) 40 MG tablet Take 40 mg by mouth daily.   nitroGLYCERIN (NITROSTAT) 0.4 MG SL tablet Place 1 tablet (0.4 mg total) under the tongue every 5 (five) minutes as needed for chest pain. Up to three times.   pravastatin (PRAVACHOL) 80 MG tablet Take 80 mg by mouth daily.   QUEtiapine (SEROQUEL) 100 MG tablet Take 200 mg by mouth at bedtime.   sertraline (ZOLOFT) 100 MG tablet Take 100 mg by mouth daily.   vitamin B-12 (CYANOCOBALAMIN) 1000 MCG tablet Take 1,000 mcg by mouth daily.   vitamin C (ASCORBIC ACID) 500 MG tablet Take 500 mg by mouth daily.     Allergies:   Crestor [rosuvastatin], Penicillins, and Gabapentin   Social History   Socioeconomic History   Marital status: Married    Spouse name: Not  on file   Number of children: Not on file   Years of education: Not on file   Highest education level: Not on file  Occupational History   Not on file  Tobacco Use   Smoking status: Never   Smokeless tobacco: Never  Substance and Sexual Activity   Alcohol use: Never   Drug use: Never   Sexual activity: Not on file  Other Topics Concern   Not on file  Social History Narrative   Not on file   Social Determinants of Health   Financial Resource Strain: Not on file  Food Insecurity: Not on file  Transportation Needs: Not on file  Physical Activity: Not on file  Stress: Not on file  Social  Connections: Not on file     Family History: The patient's family history includes Bladder Cancer in her mother; Breast cancer in her sister; Congestive Heart Failure in her father; Fibromyalgia in her sister; Hypertension in her mother and sister; Lung cancer in her sister; Prostate cancer in her father. ROS:   Please see the history of present illness.    All 14 point review of systems negative except as described per history of present illness  EKGs/Labs/Other Studies Reviewed:      Recent Labs: 08/28/2020: BUN 11; Creatinine, Ser 0.82; Magnesium 2.1; Potassium 4.1; Sodium 145  Recent Lipid Panel    Component Value Date/Time   CHOL 218 (H) 08/10/2020 0927   TRIG 178 (H) 08/10/2020 0927   HDL 50 08/10/2020 0927   CHOLHDL 4.4 08/10/2020 0927   LDLCALC 136 (H) 08/10/2020 0927    Physical Exam:    VS:  BP 118/80 (BP Location: Right Arm, Patient Position: Sitting, Cuff Size: Normal)    Pulse 70    Ht 5\' 2"  (1.575 m)    Wt 146 lb (66.2 kg)    SpO2 95%    BMI 26.70 kg/m     Wt Readings from Last 3 Encounters:  05/22/21 146 lb (66.2 kg)  10/20/20 161 lb (73 kg)  08/10/20 163 lb 6.4 oz (74.1 kg)     GEN:  Well nourished, well developed in no acute distress HEENT: Normal NECK: No JVD; No carotid bruits LYMPHATICS: No lymphadenopathy CARDIAC: RRR, no murmurs, no rubs, no gallops RESPIRATORY:  Clear to auscultation without rales, wheezing or rhonchi  ABDOMEN: Soft, non-tender, non-distended MUSCULOSKELETAL:  No edema; No deformity  SKIN: Warm and dry LOWER EXTREMITIES: no swelling NEUROLOGIC:  Alert and oriented x 3 PSYCHIATRIC:  Normal affect   ASSESSMENT:    1. Primary hypertension   2. Pericardial effusion   3. Other irritable bowel syndrome   4. Mixed hyperlipidemia    PLAN:    In order of problems listed above:  Essential hypertension blood per seems to well controlled continue present management. History of pericardial fusion now with weight loss that is  getting worse some I will repeat echocardiogram to check left ventricle ejection fraction clinically she does not have any signs and symptoms of tamponade. Irritable bowel syndrome.  Noted.  Dyslipidemia I did review K PN data from April show LDL of 136 HDL 50.  We will check her fasting lipid profile today since she did not have her breakfast   Medication Adjustments/Labs and Tests Ordered: Current medicines are reviewed at length with the patient today.  Concerns regarding medicines are outlined above.  No orders of the defined types were placed in this encounter.  Medication changes: No orders of the defined types were placed  in this encounter.   Signed, Park Liter, MD, [facility] 05/22/2021 9:56 AM    [place]

## 2021-05-22 NOTE — Patient Instructions (Signed)
Medication Instructions:  Your physician recommends that you continue on your current medications as directed. Please refer to the Current Medication list given to you today.  *If you need a refill on your cardiac medications before your next appointment, please call your pharmacy*   Lab Work: Your physician recommends that you return for lab work in: TODAY Lipids, ALT, AST If you have labs (blood work) drawn today and your tests are completely normal, you will receive your results only by: [place] (if you have MyChart) OR A paper copy in the mail If you have any lab test that is abnormal or we need to change your treatment, we will call you to review the results.   Testing/Procedures: Your physician has requested that you have an echocardiogram. Echocardiography is a painless test that uses sound waves to create images of your heart. It provides your doctor with information about the size and shape of your heart and how well your hearts chambers and valves are working. This procedure takes approximately one hour. There are no restrictions for this procedure.    Follow-Up: At [facility], you and your health needs are our priority.  As part of our continuing mission to provide you with exceptional heart care, we have created designated Provider Care Teams.  These Care Teams include your primary Cardiologist (physician) and Advanced Practice Providers (APPs -  Physician Assistants and Nurse Practitioners) who all work together to provide you with the care you need, when you need it.  We recommend signing up for the patient portal called "MyChart".  Sign up information is provided on this After Visit Summary.  MyChart is used to connect with patients for Virtual Visits (Telemedicine).  Patients are able to view lab/test results, encounter notes, upcoming appointments, etc.  Non-urgent messages can be sent to your provider as well.   To learn more about what you can do with MyChart,  go to [email or web address].    Your next appointment:   6 month(s)  The format for your next appointment:   In Person  Provider:   Jenne Campus, MD    Other Instructions

## 2021-05-22 NOTE — Addendum Note (Signed)
Addended by: Resa Miner I on: 05/22/2021 10:01 AM   Modules accepted: Orders

## 2021-05-28 ENCOUNTER — Other Ambulatory Visit: Payer: Self-pay

## 2021-05-28 DIAGNOSIS — K5790 Diverticulosis of intestine, part unspecified, without perforation or abscess without bleeding: Secondary | ICD-10-CM

## 2021-05-29 ENCOUNTER — Ambulatory Visit (INDEPENDENT_AMBULATORY_CARE_PROVIDER_SITE_OTHER): Payer: Medicare Other

## 2021-05-29 ENCOUNTER — Other Ambulatory Visit: Payer: Self-pay

## 2021-05-29 DIAGNOSIS — I3139 Other pericardial effusion (noninflammatory): Secondary | ICD-10-CM | POA: Diagnosis not present

## 2021-05-29 LAB — ECHOCARDIOGRAM COMPLETE
Area-P 1/2: 5.09 cm2
S' Lateral: 2.3 cm

## 2021-05-30 DIAGNOSIS — R3129 Other microscopic hematuria: Secondary | ICD-10-CM | POA: Diagnosis not present

## 2021-06-04 ENCOUNTER — Telehealth: Payer: Self-pay

## 2021-06-04 NOTE — Telephone Encounter (Signed)
-----   Message from Park Liter, MD sent at 05/30/2021  4:16 PM EST ----- Echocardiogram showed preserved left ventricle ejection fraction 60 to 14%, diastolic dysfunction, mild mitral valve regurgitation.

## 2021-06-04 NOTE — Telephone Encounter (Signed)
Patient notified of results. Patient stated her urologist gave her 2 pills for a procedure and since then, she been hurting form her knee. She is not sure if this coming from the meds the urologist prescribed(unknown name) or if its Zetia that may be causing her knee pain. She will be in touch about the medication prescribed from the urologist. This message was sent to Dr. Raliegh Ip as a FYI.

## 2021-06-05 ENCOUNTER — Telehealth: Payer: Self-pay | Admitting: Cardiology

## 2021-06-05 NOTE — Telephone Encounter (Signed)
° °  Pt c/o medication issue:  1. Name of Medication: levofloxacin 250 mg and zetia  2. How are you currently taking this medication (dosage and times per day)?   3. Are you having a reaction (difficulty breathing--STAT)?   4. What is your medication issue? Pt said her right knee started to swell, she is not sure what causing it, but her doctor gave her new meds levofloxacin 250 mg and her zetia, she is not sure which one causing her swelling

## 2021-06-05 NOTE — Telephone Encounter (Signed)
Advised to see her PCP regarding her knee swelling. Pt verbalized understanding and had no additional questions.

## 2021-06-12 ENCOUNTER — Telehealth: Payer: Self-pay

## 2021-06-12 NOTE — Telephone Encounter (Signed)
-----   Message from Park Liter, MD sent at 06/10/2021 12:23 PM EST ----- I do not think Zetia will do pain in just 1 knee. ----- Message ----- From: Darrel Reach, CMA Sent: 06/04/2021   2:09 PM EST To: Park Liter, MD  Patient notified of results. Patient said her urologist gave her 2 pills for a procedure, unknown name, since then she been having knee pain. She also wanted to rule out if her knee pain could be from the Zetia. She will contact us with name of the medication given by the urologist. This is a FYI until she call back with med info prescribed by Urologist.

## 2021-06-12 NOTE — Telephone Encounter (Signed)
Spoke with patient advised of the following per Dr/ K. Patient will continue with Zetia. She also mention she is planning to arrange a visit with a [facility] urologist for a second opinion. Message forward to Dr. Raliegh Ip as Juluis Rainier

## 2021-06-28 DIAGNOSIS — Z87448 Personal history of other diseases of urinary system: Secondary | ICD-10-CM | POA: Diagnosis not present

## 2021-07-18 DIAGNOSIS — D485 Neoplasm of uncertain behavior of skin: Secondary | ICD-10-CM | POA: Diagnosis not present

## 2021-07-20 DIAGNOSIS — R310 Gross hematuria: Secondary | ICD-10-CM | POA: Diagnosis not present

## 2021-10-10 DIAGNOSIS — F3341 Major depressive disorder, recurrent, in partial remission: Secondary | ICD-10-CM | POA: Diagnosis not present

## 2021-10-10 DIAGNOSIS — K219 Gastro-esophageal reflux disease without esophagitis: Secondary | ICD-10-CM | POA: Diagnosis not present

## 2021-10-10 DIAGNOSIS — I10 Essential (primary) hypertension: Secondary | ICD-10-CM | POA: Diagnosis not present

## 2021-10-10 DIAGNOSIS — E7800 Pure hypercholesterolemia, unspecified: Secondary | ICD-10-CM | POA: Diagnosis not present

## 2021-10-14 DIAGNOSIS — M25561 Pain in right knee: Secondary | ICD-10-CM | POA: Diagnosis not present

## 2021-10-22 DIAGNOSIS — M25561 Pain in right knee: Secondary | ICD-10-CM | POA: Diagnosis not present

## 2021-12-10 DIAGNOSIS — L209 Atopic dermatitis, unspecified: Secondary | ICD-10-CM | POA: Diagnosis not present

## 2021-12-10 DIAGNOSIS — L304 Erythema intertrigo: Secondary | ICD-10-CM | POA: Diagnosis not present

## 2021-12-19 DIAGNOSIS — M25561 Pain in right knee: Secondary | ICD-10-CM | POA: Diagnosis not present

## 2022-01-08 DIAGNOSIS — Z1231 Encounter for screening mammogram for malignant neoplasm of breast: Secondary | ICD-10-CM | POA: Diagnosis not present

## 2022-01-10 DIAGNOSIS — M1711 Unilateral primary osteoarthritis, right knee: Secondary | ICD-10-CM | POA: Diagnosis not present

## 2022-01-10 DIAGNOSIS — M25561 Pain in right knee: Secondary | ICD-10-CM | POA: Diagnosis not present

## 2022-02-25 DIAGNOSIS — M1711 Unilateral primary osteoarthritis, right knee: Secondary | ICD-10-CM | POA: Diagnosis not present

## 2022-02-26 ENCOUNTER — Ambulatory Visit: Payer: Medicare Other | Attending: Cardiology | Admitting: Cardiology

## 2022-02-26 ENCOUNTER — Encounter: Payer: Self-pay | Admitting: Cardiology

## 2022-02-26 VITALS — BP 136/74 | HR 75 | Ht 62.0 in | Wt 158.0 lb

## 2022-02-26 DIAGNOSIS — E782 Mixed hyperlipidemia: Secondary | ICD-10-CM

## 2022-02-26 DIAGNOSIS — I3139 Other pericardial effusion (noninflammatory): Secondary | ICD-10-CM

## 2022-02-26 DIAGNOSIS — R0789 Other chest pain: Secondary | ICD-10-CM | POA: Diagnosis not present

## 2022-02-26 DIAGNOSIS — I10 Essential (primary) hypertension: Secondary | ICD-10-CM

## 2022-02-26 NOTE — Progress Notes (Signed)
Cardiology Office Note:    Date:  02/26/2022   ID:  Amy Butler, DOB [birth year], MRN [id number]  PCP:  Greig Right, MD  Cardiologist:  Jenne Campus, MD    Referring MD: Greig Right, MD   Chief Complaint  Patient presents with   Follow-up    History of Present Illness:    Amy Butler is a 76 y.o. female  with past medical history significant for essential hypertension, dyslipidemia, irritable bowel syndrome, depression.  She did have work-up for CAD done last year which included coronary CT angio which was normal echocardiogram has been performed showed preserved left ventricle ejection fraction but there was small pericardial effusion no evidence of tamponade.  She comes today to my office for follow-up.  He is coming to my office for follow-up.  Overall she is doing very well.  She denies any chest pain tightness squeezing pressure burning chest.  She does have a problem with the knee and she did have some procedure done recently and fluid has been taken out of her knee.  Cardiac wise no shortness of breath no chest pain tightness squeezing pressure burning chest  Past Medical History:  Diagnosis Date   Dermatitis    Diverticulosis    Effects of hunger 10/14/2013   Esophageal reflux 11/02/2015   GERD (gastroesophageal reflux disease)    History of laparoscopic adjustable gastric banding 11/29/2005   Hyperlipidemia    Hypertension    IBS (irritable bowel syndrome)    Impaired mobility and ADLs    Major depression    Morbid obesity ([place]) 10/14/2013   Obsessive compulsive disorder     Past Surgical History:  Procedure Laterality Date   LAMINECTOMY     cervical region   LAPAROSCOPIC GASTRIC BANDING  2007   REFRACTIVE SURGERY     THYROIDECTOMY, PARTIAL     TUBAL LIGATION      Current Medications: Current Meds  Medication Sig   bisoprolol-hydrochlorothiazide (ZIAC) 2.5-6.25 MG tablet Take 0.5 tablets by mouth daily. Take 1/2 tablet daily   calcium  citrate-vitamin D (CITRACAL+D) 315-200 MG-UNIT tablet Take 1 tablet by mouth daily.   Cholecalciferol (VITAMIN D3) 25 MCG (1000 UT) CAPS Take 1,000 Units by mouth daily.   clonazePAM (KLONOPIN) 0.5 MG tablet Take 0.5 mg by mouth 2 (two) times daily as needed for anxiety.   ezetimibe (ZETIA) 10 MG tablet Take 1 tablet (10 mg total) by mouth daily.   famotidine (PEPCID) 40 MG tablet Take 40 mg by mouth daily.   nitroGLYCERIN (NITROSTAT) 0.4 MG SL tablet Place 1 tablet (0.4 mg total) under the tongue every 5 (five) minutes as needed for chest pain. Up to three times.   pravastatin (PRAVACHOL) 80 MG tablet Take 80 mg by mouth daily.   QUEtiapine (SEROQUEL) 100 MG tablet Take 200 mg by mouth at bedtime.   sertraline (ZOLOFT) 100 MG tablet Take 100 mg by mouth daily.   vitamin B-12 (CYANOCOBALAMIN) 1000 MCG tablet Take 1,000 mcg by mouth daily.   vitamin C (ASCORBIC ACID) 500 MG tablet Take 500 mg by mouth daily.     Allergies:   Crestor [rosuvastatin], Penicillins, and Gabapentin   Social History   Socioeconomic History   Marital status: Married    Spouse name: Not on file   Number of children: Not on file   Years of education: Not on file   Highest education level: Not on file  Occupational History   Not on file  Tobacco Use   Smoking status:  Never   Smokeless tobacco: Never  Substance and Sexual Activity   Alcohol use: Never   Drug use: Never   Sexual activity: Not on file  Other Topics Concern   Not on file  Social History Narrative   Not on file   Social Determinants of Health   Financial Resource Strain: Not on file  Food Insecurity: Not on file  Transportation Needs: Not on file  Physical Activity: Not on file  Stress: Not on file  Social Connections: Not on file     Family History: The patient's family history includes Bladder Cancer in her mother; Breast cancer in her sister; Congestive Heart Failure in her father; Fibromyalgia in her sister; Hypertension in her  mother and sister; Lung cancer in her sister; Prostate cancer in her father. ROS:   Please see the history of present illness.    All 14 point review of systems negative except as described per history of present illness  EKGs/Labs/Other Studies Reviewed:      Recent Labs: 05/22/2021: ALT 104  Recent Lipid Panel    Component Value Date/Time   CHOL 154 05/22/2021 1009   TRIG 100 05/22/2021 1009   HDL 44 05/22/2021 1009   CHOLHDL 3.5 05/22/2021 1009   LDLCALC 91 05/22/2021 1009    Physical Exam:    VS:  BP 136/74 (BP Location: Left Arm, Patient Position: Sitting)   Pulse 75   Ht '5\' 2"'$  (1.575 m)   Wt 158 lb (71.7 kg)   SpO2 96%   BMI 28.90 kg/m     Wt Readings from Last 3 Encounters:  02/26/22 158 lb (71.7 kg)  05/22/21 146 lb (66.2 kg)  10/20/20 161 lb (73 kg)     GEN:  Well nourished, well developed in no acute distress HEENT: Normal NECK: No JVD; No carotid bruits LYMPHATICS: No lymphadenopathy CARDIAC: RRR, no murmurs, no rubs, no gallops RESPIRATORY:  Clear to auscultation without rales, wheezing or rhonchi  ABDOMEN: Soft, non-tender, non-distended MUSCULOSKELETAL:  No edema; No deformity  SKIN: Warm and dry LOWER EXTREMITIES: no swelling NEUROLOGIC:  Alert and oriented x 3 PSYCHIATRIC:  Normal affect   ASSESSMENT:    1. Atypical chest pain   2. Pericardial effusion   3. Primary hypertension   4. Morbid obesity ([place])   5. Mixed hyperlipidemia    PLAN:    In order of problems listed above:  Pericardial fusion last echocardiogram showed only very small either fat or pericardial effusion.  We will continue monitoring no signs and symptoms of tamponade. Atypical chest pain denies having any, coronary CT angio was normal. His show hypertension: Blood pressure well controlled continue present management. Dyslipidemia I do have her fasting lipid profile from her primary care physician her LDL was 91 HDL 44.  We will continue present  management   Medication Adjustments/Labs and Tests Ordered: Current medicines are reviewed at length with the patient today.  Concerns regarding medicines are outlined above.  Orders Placed This Encounter  Procedures   EKG 12-Lead   Medication changes: No orders of the defined types were placed in this encounter.   Signed, Park Liter, MD, [facility] 02/26/2022 5:04 PM    [place]

## 2022-02-26 NOTE — Patient Instructions (Signed)

## 2022-03-11 DIAGNOSIS — Z0100 Encounter for examination of eyes and vision without abnormal findings: Secondary | ICD-10-CM | POA: Diagnosis not present

## 2022-05-02 DIAGNOSIS — Z0000 Encounter for general adult medical examination without abnormal findings: Secondary | ICD-10-CM | POA: Diagnosis not present

## 2022-05-02 DIAGNOSIS — Z79899 Other long term (current) drug therapy: Secondary | ICD-10-CM | POA: Diagnosis not present

## 2022-05-02 DIAGNOSIS — I10 Essential (primary) hypertension: Secondary | ICD-10-CM | POA: Diagnosis not present

## 2022-05-02 DIAGNOSIS — E7800 Pure hypercholesterolemia, unspecified: Secondary | ICD-10-CM | POA: Diagnosis not present

## 2022-05-09 DIAGNOSIS — J069 Acute upper respiratory infection, unspecified: Secondary | ICD-10-CM | POA: Diagnosis not present

## 2022-06-10 DIAGNOSIS — R319 Hematuria, unspecified: Secondary | ICD-10-CM | POA: Diagnosis not present

## 2022-06-18 DIAGNOSIS — R319 Hematuria, unspecified: Secondary | ICD-10-CM | POA: Diagnosis not present

## 2022-06-18 DIAGNOSIS — R310 Gross hematuria: Secondary | ICD-10-CM | POA: Diagnosis not present

## 2022-06-24 DIAGNOSIS — R310 Gross hematuria: Secondary | ICD-10-CM | POA: Diagnosis not present

## 2022-07-04 DIAGNOSIS — F3341 Major depressive disorder, recurrent, in partial remission: Secondary | ICD-10-CM | POA: Diagnosis not present

## 2022-07-30 DIAGNOSIS — M25561 Pain in right knee: Secondary | ICD-10-CM | POA: Diagnosis not present

## 2022-07-30 DIAGNOSIS — M1711 Unilateral primary osteoarthritis, right knee: Secondary | ICD-10-CM | POA: Diagnosis not present

## 2022-07-30 DIAGNOSIS — G8929 Other chronic pain: Secondary | ICD-10-CM | POA: Diagnosis not present

## 2022-07-31 DIAGNOSIS — K080 Exfoliation of teeth due to systemic causes: Secondary | ICD-10-CM | POA: Diagnosis not present

## 2022-08-01 DIAGNOSIS — F3341 Major depressive disorder, recurrent, in partial remission: Secondary | ICD-10-CM | POA: Diagnosis not present

## 2022-08-01 DIAGNOSIS — I10 Essential (primary) hypertension: Secondary | ICD-10-CM | POA: Diagnosis not present

## 2022-08-01 DIAGNOSIS — K219 Gastro-esophageal reflux disease without esophagitis: Secondary | ICD-10-CM | POA: Diagnosis not present

## 2022-08-01 DIAGNOSIS — E7800 Pure hypercholesterolemia, unspecified: Secondary | ICD-10-CM | POA: Diagnosis not present

## 2022-08-15 DIAGNOSIS — N200 Calculus of kidney: Secondary | ICD-10-CM | POA: Diagnosis not present

## 2022-09-17 DIAGNOSIS — F3341 Major depressive disorder, recurrent, in partial remission: Secondary | ICD-10-CM | POA: Diagnosis not present

## 2022-09-17 DIAGNOSIS — N200 Calculus of kidney: Secondary | ICD-10-CM | POA: Diagnosis not present

## 2022-10-01 DIAGNOSIS — N132 Hydronephrosis with renal and ureteral calculous obstruction: Secondary | ICD-10-CM | POA: Diagnosis not present

## 2022-10-01 DIAGNOSIS — K219 Gastro-esophageal reflux disease without esophagitis: Secondary | ICD-10-CM | POA: Diagnosis not present

## 2022-10-01 DIAGNOSIS — Z79899 Other long term (current) drug therapy: Secondary | ICD-10-CM | POA: Diagnosis not present

## 2022-10-01 DIAGNOSIS — I10 Essential (primary) hypertension: Secondary | ICD-10-CM | POA: Diagnosis not present

## 2022-10-01 DIAGNOSIS — G4733 Obstructive sleep apnea (adult) (pediatric): Secondary | ICD-10-CM | POA: Diagnosis not present

## 2022-10-31 DIAGNOSIS — G8929 Other chronic pain: Secondary | ICD-10-CM | POA: Diagnosis not present

## 2022-10-31 DIAGNOSIS — F3341 Major depressive disorder, recurrent, in partial remission: Secondary | ICD-10-CM | POA: Diagnosis not present

## 2022-10-31 DIAGNOSIS — M25561 Pain in right knee: Secondary | ICD-10-CM | POA: Diagnosis not present

## 2022-12-04 DIAGNOSIS — S81811A Laceration without foreign body, right lower leg, initial encounter: Secondary | ICD-10-CM | POA: Diagnosis not present

## 2022-12-26 DIAGNOSIS — N200 Calculus of kidney: Secondary | ICD-10-CM | POA: Diagnosis not present

## 2022-12-26 DIAGNOSIS — Z9884 Bariatric surgery status: Secondary | ICD-10-CM | POA: Diagnosis not present

## 2023-01-09 DIAGNOSIS — M25561 Pain in right knee: Secondary | ICD-10-CM | POA: Diagnosis not present

## 2023-01-09 DIAGNOSIS — G8929 Other chronic pain: Secondary | ICD-10-CM | POA: Diagnosis not present

## 2023-02-12 DIAGNOSIS — K219 Gastro-esophageal reflux disease without esophagitis: Secondary | ICD-10-CM | POA: Diagnosis not present

## 2023-02-12 DIAGNOSIS — K9509 Other complications of gastric band procedure: Secondary | ICD-10-CM | POA: Diagnosis not present

## 2023-02-12 DIAGNOSIS — Z9884 Bariatric surgery status: Secondary | ICD-10-CM | POA: Diagnosis not present

## 2023-02-18 DIAGNOSIS — D489 Neoplasm of uncertain behavior, unspecified: Secondary | ICD-10-CM | POA: Diagnosis not present

## 2023-02-18 DIAGNOSIS — Z23 Encounter for immunization: Secondary | ICD-10-CM | POA: Diagnosis not present

## 2023-02-20 DIAGNOSIS — Z1231 Encounter for screening mammogram for malignant neoplasm of breast: Secondary | ICD-10-CM | POA: Diagnosis not present

## 2023-02-24 DIAGNOSIS — K224 Dyskinesia of esophagus: Secondary | ICD-10-CM | POA: Diagnosis not present

## 2023-02-24 DIAGNOSIS — K9509 Other complications of gastric band procedure: Secondary | ICD-10-CM | POA: Diagnosis not present

## 2023-02-24 DIAGNOSIS — K219 Gastro-esophageal reflux disease without esophagitis: Secondary | ICD-10-CM | POA: Diagnosis not present

## 2023-03-03 DIAGNOSIS — D492 Neoplasm of unspecified behavior of bone, soft tissue, and skin: Secondary | ICD-10-CM | POA: Diagnosis not present

## 2023-03-03 DIAGNOSIS — B379 Candidiasis, unspecified: Secondary | ICD-10-CM | POA: Diagnosis not present

## 2023-03-05 DIAGNOSIS — Z87442 Personal history of urinary calculi: Secondary | ICD-10-CM | POA: Diagnosis not present

## 2023-03-05 DIAGNOSIS — K2289 Other specified disease of esophagus: Secondary | ICD-10-CM | POA: Diagnosis not present

## 2023-03-05 DIAGNOSIS — K2950 Unspecified chronic gastritis without bleeding: Secondary | ICD-10-CM | POA: Diagnosis not present

## 2023-03-05 DIAGNOSIS — K317 Polyp of stomach and duodenum: Secondary | ICD-10-CM | POA: Diagnosis not present

## 2023-03-05 DIAGNOSIS — K3189 Other diseases of stomach and duodenum: Secondary | ICD-10-CM | POA: Diagnosis not present

## 2023-03-05 DIAGNOSIS — Z79899 Other long term (current) drug therapy: Secondary | ICD-10-CM | POA: Diagnosis not present

## 2023-03-05 DIAGNOSIS — K2931 Chronic superficial gastritis with bleeding: Secondary | ICD-10-CM | POA: Diagnosis not present

## 2023-03-05 DIAGNOSIS — Z9884 Bariatric surgery status: Secondary | ICD-10-CM | POA: Diagnosis not present

## 2023-03-05 DIAGNOSIS — D131 Benign neoplasm of stomach: Secondary | ICD-10-CM | POA: Diagnosis not present

## 2023-03-05 DIAGNOSIS — K219 Gastro-esophageal reflux disease without esophagitis: Secondary | ICD-10-CM | POA: Diagnosis not present

## 2023-03-05 DIAGNOSIS — B9681 Helicobacter pylori [H. pylori] as the cause of diseases classified elsewhere: Secondary | ICD-10-CM | POA: Diagnosis not present

## 2023-03-12 DIAGNOSIS — K2289 Other specified disease of esophagus: Secondary | ICD-10-CM | POA: Diagnosis not present

## 2023-03-12 DIAGNOSIS — K9509 Other complications of gastric band procedure: Secondary | ICD-10-CM | POA: Diagnosis not present

## 2023-03-31 DIAGNOSIS — H02834 Dermatochalasis of left upper eyelid: Secondary | ICD-10-CM | POA: Diagnosis not present

## 2023-03-31 DIAGNOSIS — D485 Neoplasm of uncertain behavior of skin: Secondary | ICD-10-CM | POA: Diagnosis not present

## 2023-03-31 DIAGNOSIS — H02831 Dermatochalasis of right upper eyelid: Secondary | ICD-10-CM | POA: Diagnosis not present

## 2023-03-31 DIAGNOSIS — L570 Actinic keratosis: Secondary | ICD-10-CM | POA: Diagnosis not present

## 2023-05-05 DIAGNOSIS — Z0000 Encounter for general adult medical examination without abnormal findings: Secondary | ICD-10-CM | POA: Diagnosis not present

## 2023-05-05 DIAGNOSIS — I10 Essential (primary) hypertension: Secondary | ICD-10-CM | POA: Diagnosis not present

## 2023-05-05 DIAGNOSIS — E7800 Pure hypercholesterolemia, unspecified: Secondary | ICD-10-CM | POA: Diagnosis not present

## 2023-05-05 DIAGNOSIS — F3341 Major depressive disorder, recurrent, in partial remission: Secondary | ICD-10-CM | POA: Diagnosis not present

## 2023-05-05 DIAGNOSIS — Z79899 Other long term (current) drug therapy: Secondary | ICD-10-CM | POA: Diagnosis not present

## 2023-05-05 DIAGNOSIS — Z1389 Encounter for screening for other disorder: Secondary | ICD-10-CM | POA: Diagnosis not present

## 2023-05-22 DIAGNOSIS — M25561 Pain in right knee: Secondary | ICD-10-CM | POA: Diagnosis not present

## 2023-05-22 DIAGNOSIS — M1711 Unilateral primary osteoarthritis, right knee: Secondary | ICD-10-CM | POA: Diagnosis not present

## 2023-05-22 DIAGNOSIS — G8929 Other chronic pain: Secondary | ICD-10-CM | POA: Diagnosis not present

## 2023-05-22 DIAGNOSIS — I10 Essential (primary) hypertension: Secondary | ICD-10-CM | POA: Diagnosis not present

## 2023-05-22 DIAGNOSIS — Z01810 Encounter for preprocedural cardiovascular examination: Secondary | ICD-10-CM | POA: Diagnosis not present

## 2023-05-23 DIAGNOSIS — H53453 Other localized visual field defect, bilateral: Secondary | ICD-10-CM | POA: Diagnosis not present

## 2023-05-23 DIAGNOSIS — H02834 Dermatochalasis of left upper eyelid: Secondary | ICD-10-CM | POA: Diagnosis not present

## 2023-05-23 DIAGNOSIS — Z01818 Encounter for other preprocedural examination: Secondary | ICD-10-CM | POA: Diagnosis not present

## 2023-05-23 DIAGNOSIS — H02831 Dermatochalasis of right upper eyelid: Secondary | ICD-10-CM | POA: Diagnosis not present

## 2023-06-12 DIAGNOSIS — K2289 Other specified disease of esophagus: Secondary | ICD-10-CM | POA: Diagnosis not present

## 2023-06-12 DIAGNOSIS — Z9884 Bariatric surgery status: Secondary | ICD-10-CM | POA: Diagnosis not present

## 2023-06-12 DIAGNOSIS — K449 Diaphragmatic hernia without obstruction or gangrene: Secondary | ICD-10-CM | POA: Diagnosis not present

## 2023-06-12 DIAGNOSIS — K224 Dyskinesia of esophagus: Secondary | ICD-10-CM | POA: Diagnosis not present

## 2023-06-12 DIAGNOSIS — K9509 Other complications of gastric band procedure: Secondary | ICD-10-CM | POA: Diagnosis not present

## 2023-06-26 DIAGNOSIS — J029 Acute pharyngitis, unspecified: Secondary | ICD-10-CM | POA: Diagnosis not present

## 2023-07-09 DIAGNOSIS — Z4651 Encounter for fitting and adjustment of gastric lap band: Secondary | ICD-10-CM | POA: Diagnosis not present

## 2023-07-09 DIAGNOSIS — E66811 Obesity, class 1: Secondary | ICD-10-CM | POA: Diagnosis not present

## 2023-07-21 DIAGNOSIS — M1711 Unilateral primary osteoarthritis, right knee: Secondary | ICD-10-CM | POA: Diagnosis not present

## 2023-09-12 DIAGNOSIS — M25561 Pain in right knee: Secondary | ICD-10-CM | POA: Diagnosis not present

## 2023-09-12 DIAGNOSIS — M1711 Unilateral primary osteoarthritis, right knee: Secondary | ICD-10-CM | POA: Diagnosis not present

## 2023-09-12 DIAGNOSIS — G8929 Other chronic pain: Secondary | ICD-10-CM | POA: Diagnosis not present

## 2023-09-22 DIAGNOSIS — M1711 Unilateral primary osteoarthritis, right knee: Secondary | ICD-10-CM | POA: Diagnosis not present

## 2023-09-22 DIAGNOSIS — G8918 Other acute postprocedural pain: Secondary | ICD-10-CM | POA: Diagnosis not present

## 2023-09-22 DIAGNOSIS — Z96651 Presence of right artificial knee joint: Secondary | ICD-10-CM | POA: Diagnosis not present

## 2023-09-23 DIAGNOSIS — Z96651 Presence of right artificial knee joint: Secondary | ICD-10-CM | POA: Diagnosis not present

## 2023-09-23 DIAGNOSIS — M1711 Unilateral primary osteoarthritis, right knee: Secondary | ICD-10-CM | POA: Diagnosis not present

## 2023-09-24 DIAGNOSIS — M1711 Unilateral primary osteoarthritis, right knee: Secondary | ICD-10-CM | POA: Diagnosis not present

## 2023-09-24 DIAGNOSIS — Z96651 Presence of right artificial knee joint: Secondary | ICD-10-CM | POA: Diagnosis not present

## 2023-09-25 DIAGNOSIS — M1711 Unilateral primary osteoarthritis, right knee: Secondary | ICD-10-CM | POA: Diagnosis not present

## 2023-09-25 DIAGNOSIS — Z96651 Presence of right artificial knee joint: Secondary | ICD-10-CM | POA: Diagnosis not present

## 2023-09-26 DIAGNOSIS — Z96651 Presence of right artificial knee joint: Secondary | ICD-10-CM | POA: Diagnosis not present

## 2023-09-26 DIAGNOSIS — M1711 Unilateral primary osteoarthritis, right knee: Secondary | ICD-10-CM | POA: Diagnosis not present

## 2023-09-27 DIAGNOSIS — M1711 Unilateral primary osteoarthritis, right knee: Secondary | ICD-10-CM | POA: Diagnosis not present

## 2023-09-27 DIAGNOSIS — Z96651 Presence of right artificial knee joint: Secondary | ICD-10-CM | POA: Diagnosis not present

## 2023-09-28 DIAGNOSIS — Z96651 Presence of right artificial knee joint: Secondary | ICD-10-CM | POA: Diagnosis not present

## 2023-09-28 DIAGNOSIS — M1711 Unilateral primary osteoarthritis, right knee: Secondary | ICD-10-CM | POA: Diagnosis not present

## 2023-09-29 DIAGNOSIS — Z96651 Presence of right artificial knee joint: Secondary | ICD-10-CM | POA: Diagnosis not present

## 2023-09-29 DIAGNOSIS — M1711 Unilateral primary osteoarthritis, right knee: Secondary | ICD-10-CM | POA: Diagnosis not present

## 2023-09-30 DIAGNOSIS — Z96651 Presence of right artificial knee joint: Secondary | ICD-10-CM | POA: Diagnosis not present

## 2023-09-30 DIAGNOSIS — M1711 Unilateral primary osteoarthritis, right knee: Secondary | ICD-10-CM | POA: Diagnosis not present

## 2023-10-01 DIAGNOSIS — M1711 Unilateral primary osteoarthritis, right knee: Secondary | ICD-10-CM | POA: Diagnosis not present

## 2023-10-01 DIAGNOSIS — Z96651 Presence of right artificial knee joint: Secondary | ICD-10-CM | POA: Diagnosis not present

## 2023-10-02 DIAGNOSIS — Z96651 Presence of right artificial knee joint: Secondary | ICD-10-CM | POA: Diagnosis not present

## 2023-10-02 DIAGNOSIS — M1711 Unilateral primary osteoarthritis, right knee: Secondary | ICD-10-CM | POA: Diagnosis not present

## 2023-10-03 DIAGNOSIS — M1711 Unilateral primary osteoarthritis, right knee: Secondary | ICD-10-CM | POA: Diagnosis not present

## 2023-10-03 DIAGNOSIS — M62551 Muscle wasting and atrophy, not elsewhere classified, right thigh: Secondary | ICD-10-CM | POA: Diagnosis not present

## 2023-10-03 DIAGNOSIS — Z96651 Presence of right artificial knee joint: Secondary | ICD-10-CM | POA: Diagnosis not present

## 2023-10-03 DIAGNOSIS — R2689 Other abnormalities of gait and mobility: Secondary | ICD-10-CM | POA: Diagnosis not present

## 2023-10-03 DIAGNOSIS — M25561 Pain in right knee: Secondary | ICD-10-CM | POA: Diagnosis not present

## 2023-10-04 DIAGNOSIS — M1711 Unilateral primary osteoarthritis, right knee: Secondary | ICD-10-CM | POA: Diagnosis not present

## 2023-10-04 DIAGNOSIS — Z96651 Presence of right artificial knee joint: Secondary | ICD-10-CM | POA: Diagnosis not present

## 2023-10-05 DIAGNOSIS — Z96651 Presence of right artificial knee joint: Secondary | ICD-10-CM | POA: Diagnosis not present

## 2023-10-05 DIAGNOSIS — M1711 Unilateral primary osteoarthritis, right knee: Secondary | ICD-10-CM | POA: Diagnosis not present

## 2023-10-07 DIAGNOSIS — M25561 Pain in right knee: Secondary | ICD-10-CM | POA: Diagnosis not present

## 2023-10-07 DIAGNOSIS — R2689 Other abnormalities of gait and mobility: Secondary | ICD-10-CM | POA: Diagnosis not present

## 2023-10-07 DIAGNOSIS — M62551 Muscle wasting and atrophy, not elsewhere classified, right thigh: Secondary | ICD-10-CM | POA: Diagnosis not present

## 2023-10-09 DIAGNOSIS — R2689 Other abnormalities of gait and mobility: Secondary | ICD-10-CM | POA: Diagnosis not present

## 2023-10-09 DIAGNOSIS — M25561 Pain in right knee: Secondary | ICD-10-CM | POA: Diagnosis not present

## 2023-10-09 DIAGNOSIS — M62551 Muscle wasting and atrophy, not elsewhere classified, right thigh: Secondary | ICD-10-CM | POA: Diagnosis not present

## 2023-10-14 DIAGNOSIS — M62551 Muscle wasting and atrophy, not elsewhere classified, right thigh: Secondary | ICD-10-CM | POA: Diagnosis not present

## 2023-10-14 DIAGNOSIS — R2689 Other abnormalities of gait and mobility: Secondary | ICD-10-CM | POA: Diagnosis not present

## 2023-10-14 DIAGNOSIS — M25561 Pain in right knee: Secondary | ICD-10-CM | POA: Diagnosis not present

## 2023-10-15 DIAGNOSIS — E663 Overweight: Secondary | ICD-10-CM | POA: Diagnosis not present

## 2023-10-15 DIAGNOSIS — E7800 Pure hypercholesterolemia, unspecified: Secondary | ICD-10-CM | POA: Diagnosis not present

## 2023-10-15 DIAGNOSIS — F3341 Major depressive disorder, recurrent, in partial remission: Secondary | ICD-10-CM | POA: Diagnosis not present

## 2023-10-15 DIAGNOSIS — I10 Essential (primary) hypertension: Secondary | ICD-10-CM | POA: Diagnosis not present

## 2023-10-16 DIAGNOSIS — R2689 Other abnormalities of gait and mobility: Secondary | ICD-10-CM | POA: Diagnosis not present

## 2023-10-16 DIAGNOSIS — M25561 Pain in right knee: Secondary | ICD-10-CM | POA: Diagnosis not present

## 2023-10-16 DIAGNOSIS — M62551 Muscle wasting and atrophy, not elsewhere classified, right thigh: Secondary | ICD-10-CM | POA: Diagnosis not present

## 2023-10-21 DIAGNOSIS — R2689 Other abnormalities of gait and mobility: Secondary | ICD-10-CM | POA: Diagnosis not present

## 2023-10-21 DIAGNOSIS — M25561 Pain in right knee: Secondary | ICD-10-CM | POA: Diagnosis not present

## 2023-10-21 DIAGNOSIS — M62551 Muscle wasting and atrophy, not elsewhere classified, right thigh: Secondary | ICD-10-CM | POA: Diagnosis not present

## 2023-10-23 DIAGNOSIS — M62551 Muscle wasting and atrophy, not elsewhere classified, right thigh: Secondary | ICD-10-CM | POA: Diagnosis not present

## 2023-10-23 DIAGNOSIS — R2689 Other abnormalities of gait and mobility: Secondary | ICD-10-CM | POA: Diagnosis not present

## 2023-10-23 DIAGNOSIS — M25561 Pain in right knee: Secondary | ICD-10-CM | POA: Diagnosis not present

## 2023-10-28 DIAGNOSIS — M25561 Pain in right knee: Secondary | ICD-10-CM | POA: Diagnosis not present

## 2023-10-28 DIAGNOSIS — M62551 Muscle wasting and atrophy, not elsewhere classified, right thigh: Secondary | ICD-10-CM | POA: Diagnosis not present

## 2023-10-28 DIAGNOSIS — R2689 Other abnormalities of gait and mobility: Secondary | ICD-10-CM | POA: Diagnosis not present

## 2023-10-30 DIAGNOSIS — M62551 Muscle wasting and atrophy, not elsewhere classified, right thigh: Secondary | ICD-10-CM | POA: Diagnosis not present

## 2023-10-30 DIAGNOSIS — R2689 Other abnormalities of gait and mobility: Secondary | ICD-10-CM | POA: Diagnosis not present

## 2023-10-30 DIAGNOSIS — M25561 Pain in right knee: Secondary | ICD-10-CM | POA: Diagnosis not present

## 2023-11-03 DIAGNOSIS — M62551 Muscle wasting and atrophy, not elsewhere classified, right thigh: Secondary | ICD-10-CM | POA: Diagnosis not present

## 2023-11-03 DIAGNOSIS — R2689 Other abnormalities of gait and mobility: Secondary | ICD-10-CM | POA: Diagnosis not present

## 2023-11-03 DIAGNOSIS — M25561 Pain in right knee: Secondary | ICD-10-CM | POA: Diagnosis not present

## 2023-11-04 DIAGNOSIS — J22 Unspecified acute lower respiratory infection: Secondary | ICD-10-CM | POA: Diagnosis not present

## 2023-11-11 DIAGNOSIS — K080 Exfoliation of teeth due to systemic causes: Secondary | ICD-10-CM | POA: Diagnosis not present

## 2023-11-13 DIAGNOSIS — Z96651 Presence of right artificial knee joint: Secondary | ICD-10-CM | POA: Diagnosis not present

## 2023-12-10 ENCOUNTER — Encounter: Payer: Self-pay | Admitting: Cardiology

## 2023-12-10 ENCOUNTER — Ambulatory Visit: Attending: Cardiology | Admitting: Cardiology

## 2023-12-10 VITALS — BP 126/72 | HR 76 | Ht 62.0 in | Wt 154.0 lb

## 2023-12-10 DIAGNOSIS — R0789 Other chest pain: Secondary | ICD-10-CM

## 2023-12-10 DIAGNOSIS — I10 Essential (primary) hypertension: Secondary | ICD-10-CM | POA: Diagnosis not present

## 2023-12-10 DIAGNOSIS — E782 Mixed hyperlipidemia: Secondary | ICD-10-CM

## 2023-12-10 DIAGNOSIS — R0609 Other forms of dyspnea: Secondary | ICD-10-CM | POA: Diagnosis not present

## 2023-12-10 DIAGNOSIS — I3139 Other pericardial effusion (noninflammatory): Secondary | ICD-10-CM

## 2023-12-10 NOTE — Progress Notes (Unsigned)
 Cardiology Office Note:    Date:  12/10/2023   ID:  Amy Butler, DOB 1945-06-28, MRN [id number]  PCP:  Gayl Males, MD  Cardiologist:  Lamar Fitch, MD    Referring MD: Gayl Males, MD   No chief complaint on file.   History of Present Illness:    Amy Butler is a 78 y.o. female  Regular irregular forearm past medical history significant for essential hypertension dyslipidemia irritable bowel syndrome depression did have workup done for coronary artery disease afebrile score CT angio which did not show any abnormality, echocardiogram has been normal except for the fact that she appears to have small pericardial fusion comes today to my office for follow-up after being seen for almost 2 years.  Overall majority of time he is doing well she did have knee replacement surgery done and she is very happy and satisfied with results however she said that initial 2 weeks were horrible.  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 Take 1/2 tablet daily   calcium citrate-vitamin D (CITRACAL+D) 315-200 MG-UNIT tablet Take 1 tablet by mouth daily.   Cholecalciferol (VITAMIN D3) 25  MCG (1000 UT) CAPS Take 1,000 Units by mouth daily.   clonazePAM (KLONOPIN) 0.5 MG tablet Take 0.5 mg by mouth 2 (two) times daily as needed for anxiety.   ezetimibe  (ZETIA ) 10 MG tablet Take 1 tablet (10 mg total) by mouth daily.   famotidine (PEPCID) 40 MG tablet Take 40 mg by mouth daily.   nitroGLYCERIN  (NITROSTAT ) 0.4 MG SL tablet Place 1 tablet (0.4 mg total) under the tongue every 5 (five) minutes as needed for chest pain. Up to three times.   pravastatin (PRAVACHOL) 80 MG tablet Take 80 mg by mouth daily.   QUEtiapine (SEROQUEL) 100 MG tablet Take 200 mg by mouth at bedtime.   sertraline (ZOLOFT) 100 MG tablet Take 100 mg by mouth daily.   vitamin B-12 (CYANOCOBALAMIN) 1000 MCG tablet Take 1,000 mcg by mouth daily.   vitamin C (ASCORBIC ACID) 500 MG tablet Take 500 mg by mouth daily.     Allergies:   Crestor [rosuvastatin], Penicillins, and Gabapentin   Social History   Socioeconomic History   Marital status: Married    Spouse name: Not on file   Number of children: Not on file   Years of education: Not on file   Highest education level: Not on file  Occupational History   Not on file  Tobacco Use   Smoking status: Never   Smokeless tobacco: Never  Substance and Sexual Activity   Alcohol  use: Never   Drug use: Never  Sexual activity: Not on file  Other Topics Concern   Not on file  Social History Narrative   Not on file   Social Drivers of Health   Financial Resource Strain: Not on file  Food Insecurity: Not on file  Transportation Needs: Not on file  Physical Activity: Not on file  Stress: Not on file  Social Connections: Not on file     Family History: The patient's family history includes Bladder Cancer in her mother; Breast cancer in her sister; Congestive Heart Failure in her father; Fibromyalgia in her sister; Hypertension in her mother and sister; Lung cancer in her sister; Prostate cancer in her father. ROS:   Please see the history of present  illness.    All 14 point review of systems negative except as described per history of present illness  EKGs/Labs/Other Studies Reviewed:    EKG Interpretation Date/Time:  Wednesday December 10 2023 14:16:08 EDT Ventricular Rate:  76 PR Interval:  156 QRS Duration:  68 QT Interval:  372 QTC Calculation: 418 R Axis:   -33  Text Interpretation: Normal sinus rhythm Possible Left atrial enlargement Left axis deviation Anterior infarct , age undetermined No previous ECGs available Confirmed by Bernie Charleston [phone]) on 12/10/2023 2:21:51 PM    Recent Labs: No results found for requested labs within last 365 days.  Recent Lipid Panel    Component Value Date/Time   CHOL 154 05/22/2021 1009   TRIG 100 05/22/2021 1009   HDL 44 05/22/2021 1009   CHOLHDL 3.5 05/22/2021 1009   LDLCALC 91 05/22/2021 1009    Physical Exam:    VS:  BP 126/72   Pulse 76   Ht 5' 2 (1.575 m)   Wt 154 lb (69.9 kg)   SpO2 97%   BMI 28.17 kg/m     Wt Readings from Last 3 Encounters:  12/10/23 154 lb (69.9 kg)  02/26/22 158 lb (71.7 kg)  05/22/21 146 lb (66.2 kg)     GEN:  Well nourished, well developed in no acute distress HEENT: Normal NECK: No JVD; No carotid bruits LYMPHATICS: No lymphadenopathy CARDIAC: RRR, no murmurs, no rubs, no gallops RESPIRATORY:  Clear to auscultation without rales, wheezing or rhonchi  ABDOMEN: Soft, non-tender, non-distended MUSCULOSKELETAL:  No edema; No deformity  SKIN: Warm and dry LOWER EXTREMITIES: no swelling NEUROLOGIC:  Alert and oriented x 3 PSYCHIATRIC:  Normal affect   ASSESSMENT:    1. Primary hypertension   2. Mixed hyperlipidemia   3. Dyspnea on exertion   4. Pericardial effusion   5. Atypical chest pain    PLAN:    In order of problems listed above:  Questionable pericardial effusion echocardiac will be repeated.  Likely he does not have signs and symptoms of tamponade but sometimes slow accumulation of the fluid could be completely  asymptomatic. Dyspnea on exertion denies having any. Dyslipidemia I did review blood test done and K PN LDL 91 HDL 44 however this is from 2023 will need to be repeated which we will do. Essential hypertension blood pressure well-controlled continue present management   Medication Adjustments/Labs and Tests Ordered: Current medicines are reviewed at length with the patient today.  Concerns regarding medicines are outlined above.  Orders Placed This Encounter  Procedures   EKG 12-Lead   ECHOCARDIOGRAM COMPLETE   Medication changes: No orders of the defined types were placed in this encounter.   Signed, [place] DOROTHA Bernie, MD, [facility] 12/10/2023 2:33 PM    [facility]

## 2023-12-10 NOTE — Patient Instructions (Signed)
 Medication Instructions:  Your physician recommends that you continue on your current medications as directed. Please refer to the Current Medication list given to you today.  *If you need a refill on your cardiac medications before your next appointment, please call your pharmacy*   Lab Work: None Ordered If you have labs (blood work) drawn today and your tests are completely normal, you will receive your results only by: MyChart Message (if you have MyChart) OR A paper copy in the mail If you have any lab test that is abnormal or we need to change your treatment, we will call you to review the results.   Testing/Procedures: Your physician has requested that you have an echocardiogram. Echocardiography is a painless test that uses sound waves to create images of your heart. It provides your doctor with information about the size and shape of your heart and how well your heart's chambers and valves are working. This procedure takes approximately one hour. There are no restrictions for this procedure. Please do NOT wear cologne, perfume, aftershave, or lotions (deodorant is allowed). Please arrive 15 minutes prior to your appointment time.  Please note: We ask at that you not bring children with you during ultrasound (echo/ vascular) testing. Due to room size and safety concerns, children are not allowed in the ultrasound rooms during exams. Our front office staff cannot provide observation of children in our lobby area while testing is being conducted. An adult accompanying a patient to their appointment will only be allowed in the ultrasound room at the discretion of the ultrasound technician under special circumstances. We apologize for any inconvenience.    Follow-Up: At [facility], you and your health needs are our priority.  As part of our continuing mission to provide you with exceptional heart care, we have created designated Provider Care Teams.  These Care Teams include your  primary Cardiologist (physician) and Advanced Practice Providers (APPs -  Physician Assistants and Nurse Practitioners) who all work together to provide you with the care you need, when you need it.  We recommend signing up for the patient portal called "MyChart".  Sign up information is provided on this After Visit Summary.  MyChart is used to connect with patients for Virtual Visits (Telemedicine).  Patients are able to view lab/test results, encounter notes, upcoming appointments, etc.  Non-urgent messages can be sent to your provider as well.   To learn more about what you can do with MyChart, go to [email or web address].    Your next appointment:   12 month(s)  The format for your next appointment:   In Person  Provider:   Gypsy Balsam, MD    Other Instructions NA

## 2023-12-16 DIAGNOSIS — N200 Calculus of kidney: Secondary | ICD-10-CM | POA: Diagnosis not present

## 2023-12-24 DIAGNOSIS — K080 Exfoliation of teeth due to systemic causes: Secondary | ICD-10-CM | POA: Diagnosis not present

## 2023-12-25 DIAGNOSIS — F3341 Major depressive disorder, recurrent, in partial remission: Secondary | ICD-10-CM | POA: Diagnosis not present

## 2024-01-13 DIAGNOSIS — H6121 Impacted cerumen, right ear: Secondary | ICD-10-CM | POA: Diagnosis not present

## 2024-01-13 DIAGNOSIS — H9191 Unspecified hearing loss, right ear: Secondary | ICD-10-CM | POA: Diagnosis not present

## 2024-01-14 ENCOUNTER — Ambulatory Visit: Attending: Cardiology

## 2024-01-14 DIAGNOSIS — R0609 Other forms of dyspnea: Secondary | ICD-10-CM | POA: Diagnosis not present

## 2024-01-15 DIAGNOSIS — Z96651 Presence of right artificial knee joint: Secondary | ICD-10-CM | POA: Diagnosis not present

## 2024-01-15 LAB — ECHOCARDIOGRAM COMPLETE
AR max vel: 1.61 cm2
AV Area VTI: 1.69 cm2
AV Area mean vel: 1.56 cm2
AV Mean grad: 3 mmHg
AV Peak grad: 5.2 mmHg
Ao pk vel: 1.14 m/s
Area-P 1/2: 3.66 cm2
MV VTI: 0.86 cm2
S' Lateral: 2.1 cm

## 2024-01-16 ENCOUNTER — Ambulatory Visit: Payer: Self-pay | Admitting: Cardiology

## 2024-01-22 ENCOUNTER — Telehealth: Payer: Self-pay

## 2024-01-22 NOTE — Telephone Encounter (Signed)
 Pt viewed Echo results on My Chart per Dr. Vanetta Shawl note. Routed to PCP.

## 2024-02-03 DIAGNOSIS — L209 Atopic dermatitis, unspecified: Secondary | ICD-10-CM | POA: Diagnosis not present

## 2024-02-03 DIAGNOSIS — L304 Erythema intertrigo: Secondary | ICD-10-CM | POA: Diagnosis not present

## 2024-02-11 DIAGNOSIS — Z23 Encounter for immunization: Secondary | ICD-10-CM | POA: Diagnosis not present

## 2024-02-11 DIAGNOSIS — E663 Overweight: Secondary | ICD-10-CM | POA: Diagnosis not present

## 2024-02-11 DIAGNOSIS — I10 Essential (primary) hypertension: Secondary | ICD-10-CM | POA: Diagnosis not present

## 2024-02-11 DIAGNOSIS — E7800 Pure hypercholesterolemia, unspecified: Secondary | ICD-10-CM | POA: Diagnosis not present

## 2024-02-11 DIAGNOSIS — F3341 Major depressive disorder, recurrent, in partial remission: Secondary | ICD-10-CM | POA: Diagnosis not present

## 2024-02-26 DIAGNOSIS — K080 Exfoliation of teeth due to systemic causes: Secondary | ICD-10-CM | POA: Diagnosis not present

## 2024-03-09 DIAGNOSIS — Z1231 Encounter for screening mammogram for malignant neoplasm of breast: Secondary | ICD-10-CM | POA: Diagnosis not present

## 2024-03-15 DIAGNOSIS — J209 Acute bronchitis, unspecified: Secondary | ICD-10-CM | POA: Diagnosis not present

## 2024-03-22 DIAGNOSIS — J209 Acute bronchitis, unspecified: Secondary | ICD-10-CM | POA: Diagnosis not present

## 2024-03-22 DIAGNOSIS — J0100 Acute maxillary sinusitis, unspecified: Secondary | ICD-10-CM | POA: Diagnosis not present

## 2024-03-22 DIAGNOSIS — R21 Rash and other nonspecific skin eruption: Secondary | ICD-10-CM | POA: Diagnosis not present

## 2024-03-30 DIAGNOSIS — K080 Exfoliation of teeth due to systemic causes: Secondary | ICD-10-CM | POA: Diagnosis not present

## 2024-04-19 DIAGNOSIS — Z0000 Encounter for general adult medical examination without abnormal findings: Secondary | ICD-10-CM | POA: Diagnosis not present

## 2024-04-19 DIAGNOSIS — Z9181 History of falling: Secondary | ICD-10-CM | POA: Diagnosis not present

## 2024-04-19 DIAGNOSIS — E669 Obesity, unspecified: Secondary | ICD-10-CM | POA: Diagnosis not present

## 2024-04-19 DIAGNOSIS — Z1389 Encounter for screening for other disorder: Secondary | ICD-10-CM | POA: Diagnosis not present
